# Patient Record
Sex: Female | Born: 1975 | Race: White | Hispanic: No | Marital: Married | Smoking: Former smoker
Health system: Southern US, Community
[De-identification: ages and names within clinical notes are randomized; demographics above are authoritative.]

## PROBLEM LIST (undated history)

## (undated) DIAGNOSIS — Z87442 Personal history of urinary calculi: Secondary | ICD-10-CM

## (undated) DIAGNOSIS — I2699 Other pulmonary embolism without acute cor pulmonale: Secondary | ICD-10-CM

## (undated) DIAGNOSIS — I82409 Acute embolism and thrombosis of unspecified deep veins of unspecified lower extremity: Secondary | ICD-10-CM

## (undated) DIAGNOSIS — I809 Phlebitis and thrombophlebitis of unspecified site: Secondary | ICD-10-CM

## (undated) HISTORY — PX: FOOT SURGERY: SHX648

## (undated) HISTORY — PX: LAPAROSCOPIC GASTRIC SLEEVE RESECTION: SHX5895

## (undated) HISTORY — DX: Phlebitis and thrombophlebitis of unspecified site: I80.9

## (undated) HISTORY — PX: ANKLE SURGERY: SHX546

---

## 1998-02-07 ENCOUNTER — Ambulatory Visit (HOSPITAL_COMMUNITY): Admission: RE | Admit: 1998-02-07 | Discharge: 1998-02-07 | Payer: Self-pay | Admitting: Orthopedic Surgery

## 1998-12-24 ENCOUNTER — Other Ambulatory Visit: Admission: RE | Admit: 1998-12-24 | Discharge: 1998-12-24 | Payer: Self-pay | Admitting: Obstetrics & Gynecology

## 2001-07-30 ENCOUNTER — Encounter: Admission: RE | Admit: 2001-07-30 | Discharge: 2001-07-30 | Payer: Self-pay | Admitting: Family Medicine

## 2001-07-30 ENCOUNTER — Encounter: Payer: Self-pay | Admitting: Family Medicine

## 2001-08-02 ENCOUNTER — Ambulatory Visit (HOSPITAL_COMMUNITY): Admission: RE | Admit: 2001-08-02 | Discharge: 2001-08-02 | Payer: Self-pay | Admitting: Family Medicine

## 2002-07-12 ENCOUNTER — Emergency Department (HOSPITAL_COMMUNITY): Admission: EM | Admit: 2002-07-12 | Discharge: 2002-07-12 | Payer: Self-pay | Admitting: Emergency Medicine

## 2003-02-17 ENCOUNTER — Encounter: Payer: Self-pay | Admitting: Family Medicine

## 2003-02-17 ENCOUNTER — Encounter: Admission: RE | Admit: 2003-02-17 | Discharge: 2003-02-17 | Payer: Self-pay | Admitting: Family Medicine

## 2004-04-05 ENCOUNTER — Emergency Department (HOSPITAL_COMMUNITY): Admission: EM | Admit: 2004-04-05 | Discharge: 2004-04-05 | Payer: Self-pay | Admitting: Emergency Medicine

## 2004-11-01 ENCOUNTER — Other Ambulatory Visit: Admission: RE | Admit: 2004-11-01 | Discharge: 2004-11-01 | Payer: Self-pay | Admitting: Family Medicine

## 2004-11-12 ENCOUNTER — Encounter: Admission: RE | Admit: 2004-11-12 | Discharge: 2004-11-12 | Payer: Self-pay | Admitting: Neurological Surgery

## 2006-10-06 ENCOUNTER — Emergency Department (HOSPITAL_COMMUNITY): Admission: EM | Admit: 2006-10-06 | Discharge: 2006-10-06 | Payer: Self-pay | Admitting: Emergency Medicine

## 2006-11-02 ENCOUNTER — Other Ambulatory Visit: Admission: RE | Admit: 2006-11-02 | Discharge: 2006-11-02 | Payer: Self-pay | Admitting: Family Medicine

## 2007-10-25 ENCOUNTER — Encounter: Admission: RE | Admit: 2007-10-25 | Discharge: 2007-10-25 | Payer: Self-pay | Admitting: Orthopedic Surgery

## 2007-11-22 ENCOUNTER — Other Ambulatory Visit: Admission: RE | Admit: 2007-11-22 | Discharge: 2007-11-22 | Payer: Self-pay | Admitting: Family Medicine

## 2008-07-25 ENCOUNTER — Emergency Department (HOSPITAL_COMMUNITY): Admission: EM | Admit: 2008-07-25 | Discharge: 2008-07-25 | Payer: Self-pay | Admitting: Emergency Medicine

## 2008-11-27 ENCOUNTER — Other Ambulatory Visit: Admission: RE | Admit: 2008-11-27 | Discharge: 2008-11-27 | Payer: Self-pay | Admitting: Family Medicine

## 2009-12-24 ENCOUNTER — Other Ambulatory Visit: Admission: RE | Admit: 2009-12-24 | Discharge: 2009-12-24 | Payer: Self-pay | Admitting: Family Medicine

## 2010-10-06 ENCOUNTER — Encounter: Payer: Self-pay | Admitting: Family Medicine

## 2011-01-22 ENCOUNTER — Other Ambulatory Visit (HOSPITAL_COMMUNITY)
Admission: RE | Admit: 2011-01-22 | Discharge: 2011-01-22 | Disposition: A | Discharge status: Home / Self Care | Payer: Self-pay | Source: Ambulatory Visit | Attending: Family Medicine | Admitting: Family Medicine

## 2011-01-22 ENCOUNTER — Other Ambulatory Visit: Payer: Self-pay | Admitting: Family Medicine

## 2011-01-22 DIAGNOSIS — Z0000 Encounter for general adult medical examination without abnormal findings: Secondary | ICD-10-CM | POA: Insufficient documentation

## 2011-06-17 LAB — BASIC METABOLIC PANEL
CO2: 22
Calcium: 9.2
Creatinine, Ser: 0.8
GFR calc Af Amer: >60
GFR calc non Af Amer: >60

## 2011-06-17 LAB — DIFFERENTIAL
Basophils Absolute: 0.1
Basophils Relative: 1
Eosinophils Absolute: 0.1
Eosinophils Relative: 2
Neutrophils Relative %: 66
Smear Review: ADEQUATE

## 2011-06-17 LAB — CBC
HCT: 40.3
MCHC: 33.7
RDW: 12.9

## 2011-12-15 DIAGNOSIS — I809 Phlebitis and thrombophlebitis of unspecified site: Secondary | ICD-10-CM

## 2011-12-15 HISTORY — DX: Phlebitis and thrombophlebitis of unspecified site: I80.9

## 2011-12-29 ENCOUNTER — Other Ambulatory Visit: Payer: Self-pay | Admitting: Family Medicine

## 2011-12-29 ENCOUNTER — Ambulatory Visit
Admission: RE | Admit: 2011-12-29 | Discharge: 2011-12-29 | Disposition: A | Discharge status: Home / Self Care | Payer: BC Managed Care – PPO | Source: Ambulatory Visit | Attending: Family Medicine | Admitting: Family Medicine

## 2011-12-29 DIAGNOSIS — M79606 Pain in leg, unspecified: Secondary | ICD-10-CM

## 2012-01-23 ENCOUNTER — Other Ambulatory Visit: Payer: Self-pay | Admitting: Family Medicine

## 2012-01-23 ENCOUNTER — Other Ambulatory Visit (HOSPITAL_COMMUNITY)
Admission: RE | Admit: 2012-01-23 | Discharge: 2012-01-23 | Disposition: A | Discharge status: Home / Self Care | Payer: BC Managed Care – PPO | Source: Ambulatory Visit | Attending: Family Medicine | Admitting: Family Medicine

## 2012-01-23 DIAGNOSIS — Z0000 Encounter for general adult medical examination without abnormal findings: Secondary | ICD-10-CM | POA: Insufficient documentation

## 2012-11-23 ENCOUNTER — Ambulatory Visit
Admission: RE | Admit: 2012-11-23 | Discharge: 2012-11-23 | Disposition: A | Discharge status: Home / Self Care | Payer: BC Managed Care – PPO | Source: Ambulatory Visit | Attending: Family Medicine | Admitting: Family Medicine

## 2012-11-23 ENCOUNTER — Other Ambulatory Visit: Payer: Self-pay | Admitting: Family Medicine

## 2012-11-23 ENCOUNTER — Inpatient Hospital Stay (HOSPITAL_COMMUNITY)
Admission: EM | Admit: 2012-11-23 | Discharge: 2012-11-26 | DRG: 541 | Disposition: A | Discharge status: Home / Self Care | Payer: BC Managed Care – PPO | Attending: Internal Medicine | Admitting: Internal Medicine

## 2012-11-23 ENCOUNTER — Encounter (HOSPITAL_COMMUNITY): Payer: Self-pay | Admitting: *Deleted

## 2012-11-23 DIAGNOSIS — R0600 Dyspnea, unspecified: Secondary | ICD-10-CM

## 2012-11-23 DIAGNOSIS — R519 Headache, unspecified: Secondary | ICD-10-CM | POA: Diagnosis present

## 2012-11-23 DIAGNOSIS — I2699 Other pulmonary embolism without acute cor pulmonale: Principal | ICD-10-CM

## 2012-11-23 DIAGNOSIS — Z87891 Personal history of nicotine dependence: Secondary | ICD-10-CM

## 2012-11-23 DIAGNOSIS — I82409 Acute embolism and thrombosis of unspecified deep veins of unspecified lower extremity: Secondary | ICD-10-CM | POA: Diagnosis present

## 2012-11-23 DIAGNOSIS — Z881 Allergy status to other antibiotic agents status: Secondary | ICD-10-CM

## 2012-11-23 DIAGNOSIS — R51 Headache: Secondary | ICD-10-CM | POA: Diagnosis present

## 2012-11-23 DIAGNOSIS — E669 Obesity, unspecified: Secondary | ICD-10-CM | POA: Diagnosis present

## 2012-11-23 DIAGNOSIS — E871 Hypo-osmolality and hyponatremia: Secondary | ICD-10-CM

## 2012-11-23 DIAGNOSIS — Z79899 Other long term (current) drug therapy: Secondary | ICD-10-CM

## 2012-11-23 DIAGNOSIS — Z6841 Body Mass Index (BMI) 40.0 and over, adult: Secondary | ICD-10-CM

## 2012-11-23 DIAGNOSIS — R911 Solitary pulmonary nodule: Secondary | ICD-10-CM

## 2012-11-23 DIAGNOSIS — I82402 Acute embolism and thrombosis of unspecified deep veins of left lower extremity: Secondary | ICD-10-CM | POA: Diagnosis present

## 2012-11-23 LAB — BASIC METABOLIC PANEL
CO2: 18 mEq/L — ABNORMAL LOW (ref 19–32)
Creatinine, Ser: 0.71 mg/dL (ref 0.50–1.10)
GFR calc Af Amer: >90 mL/min (ref >90)
GFR calc non Af Amer: >90 mL/min (ref >90)
Glucose, Bld: 134 mg/dL — ABNORMAL HIGH (ref 70–99)
Potassium: 5.4 mEq/L — ABNORMAL HIGH (ref 3.5–5.1)
Sodium: 134 mEq/L — ABNORMAL LOW (ref 135–145)

## 2012-11-23 LAB — CBC WITH DIFFERENTIAL/PLATELET
HCT: 41.4 % (ref 36.0–46.0)
Lymphocytes Relative: 20 % (ref 12–46)
MCH: 30.6 pg (ref 26.0–34.0)
Neutro Abs: 5.9 10*3/uL (ref 1.7–7.7)
Platelets: 338 10*3/uL (ref 150–400)
RBC: 4.67 MIL/uL (ref 3.87–5.11)
WBC: 8 10*3/uL (ref 4.0–10.5)

## 2012-11-23 LAB — POTASSIUM: Potassium: 3.5 mEq/L (ref 3.5–5.1)

## 2012-11-23 LAB — APTT: aPTT: 27 seconds (ref 24–37)

## 2012-11-23 LAB — PROTIME-INR: INR: 0.94 (ref 0.00–1.49)

## 2012-11-23 LAB — ANTITHROMBIN III: AntiThromb III Func: 92 % (ref 75–120)

## 2012-11-23 MED ORDER — HEPARIN (PORCINE) IN NACL 100-0.45 UNIT/ML-% IJ SOLN
1500.0000 [IU]/h | INTRAMUSCULAR | Status: DC
  Administered 2012-11-23: 1500 [IU]/h via INTRAVENOUS
  Filled 2012-11-23: qty 250

## 2012-11-23 MED ORDER — BISACODYL 5 MG PO TBEC
10.0000 mg | DELAYED_RELEASE_TABLET | Freq: Every day | ORAL | Status: DC | PRN
  Administered 2012-11-25: 10 mg via ORAL
  Filled 2012-11-23 (×2): qty 1

## 2012-11-23 MED ORDER — WARFARIN VIDEO: Freq: Once | Status: DC

## 2012-11-23 MED ORDER — HEPARIN BOLUS VIA INFUSION
4500.0000 [IU] | Freq: Once | INTRAVENOUS | Status: AC
  Administered 2012-11-23: 4500 [IU] via INTRAVENOUS

## 2012-11-23 MED ORDER — ONDANSETRON HCL 4 MG/2ML IJ SOLN: 4.0000 mg | Freq: Four times a day (QID) | INTRAMUSCULAR | Status: DC | PRN

## 2012-11-23 MED ORDER — ACETAMINOPHEN 325 MG PO TABS
650.0000 mg | ORAL_TABLET | Freq: Four times a day (QID) | ORAL | Status: DC | PRN
  Administered 2012-11-24 – 2012-11-25 (×4): 650 mg via ORAL
  Filled 2012-11-23 (×4): qty 2

## 2012-11-23 MED ORDER — HEPARIN (PORCINE) IN NACL 100-0.45 UNIT/ML-% IJ SOLN
1500.0000 [IU]/h | INTRAMUSCULAR | Status: DC
  Filled 2012-11-23: qty 250

## 2012-11-23 MED ORDER — WARFARIN - PHARMACIST DOSING INPATIENT
Freq: Every day | Status: DC
  Administered 2012-11-25: 18:00:00

## 2012-11-23 MED ORDER — ONDANSETRON HCL 4 MG PO TABS
4.0000 mg | ORAL_TABLET | Freq: Four times a day (QID) | ORAL | Status: DC | PRN
  Administered 2012-11-24: 4 mg via ORAL
  Filled 2012-11-23: qty 1

## 2012-11-23 MED ORDER — SODIUM CHLORIDE 0.9 % IV SOLN: INTRAVENOUS | Status: AC

## 2012-11-23 MED ORDER — WARFARIN SODIUM 10 MG PO TABS: 10.0000 mg | ORAL_TABLET | Freq: Once | ORAL | Status: DC

## 2012-11-23 MED ORDER — WARFARIN SODIUM 10 MG PO TABS
10.0000 mg | ORAL_TABLET | Freq: Once | ORAL | Status: AC
  Administered 2012-11-23: 10 mg via ORAL
  Filled 2012-11-23: qty 1

## 2012-11-23 MED ORDER — IOHEXOL 350 MG/ML SOLN
125.0000 mL | Freq: Once | INTRAVENOUS | Status: AC | PRN
  Administered 2012-11-23: 125 mL via INTRAVENOUS

## 2012-11-23 MED ORDER — SODIUM CHLORIDE 0.9 % IV SOLN
INTRAVENOUS | Status: AC
  Administered 2012-11-23: 17:00:00 via INTRAVENOUS

## 2012-11-23 MED ORDER — COUMADIN BOOK
Freq: Once | Status: AC
  Administered 2012-11-24: 10:00:00
  Filled 2012-11-23 (×2): qty 1

## 2012-11-23 MED ORDER — OXYCODONE HCL 5 MG PO TABS
5.0000 mg | ORAL_TABLET | ORAL | Status: DC | PRN
  Administered 2012-11-24 (×3): 5 mg via ORAL
  Filled 2012-11-23 (×3): qty 1

## 2012-11-23 MED ORDER — ACETAMINOPHEN 650 MG RE SUPP: 650.0000 mg | Freq: Four times a day (QID) | RECTAL | Status: DC | PRN

## 2012-11-23 NOTE — ED Notes (Signed)
[id number]<YN> Expected date:<BR> Expected time:<BR> Means of arrival:<BR> Comments:<BR> Hold  For triage

## 2012-11-23 NOTE — ED Provider Notes (Signed)
History     CSN: [id number]  Arrival date & time 11/23/12  1514   First MD Initiated Contact with Patient 11/23/12 1552      Chief Complaint  Patient presents with  . Pulmonary Emboli     (Consider location/radiation/quality/duration/timing/severity/associated sxs/prior treatment) HPI 37 y.o. Female complaining of sob and chest pain began 2/5 and worsening.  Seen at [facility], [place],  Seen twice prior to today and told pulled muscle.  Yesterday, very sob, went in today and saw [place], [place].  CT ordered and done at [facility] imaging.  Positive for pe and told to come here.  Patient has been dyspneic with any exertion,some pain left lateral chest.  Patient has noted some swelling in legs especially with exertion.  Patient on bcp History reviewed. No pertinent past medical history.  History reviewed. No pertinent past surgical history.  No family history on file.  History  Substance Use Topics  . Smoking status: Never Smoker   . Smokeless tobacco: Not on file  . Alcohol Use: No    OB History   Grav Para Term Preterm Abortions TAB SAB Ect Mult Living                  Review of Systems  All other systems reviewed and are negative.    Allergies  Augmentin  Home Medications   Current Outpatient Rx  Name  Route  Sig  Dispense  Refill  . PRESCRIPTION MEDICATION   Oral   Take 1 tablet by mouth daily. Birth control one once a day         . traMADol (ULTRAM) 50 MG tablet   Oral   Take 50 mg by mouth every 6 (six) hours as needed for pain.           BP 162/88  Pulse 98  Temp(Src) 98.4 F (36.9 C) (Oral)  Resp 18  SpO2 98%  LMP 10/26/2012  Physical Exam  Nursing note and vitals reviewed. Constitutional: She appears well-developed and well-nourished.  HENT:  Head: Normocephalic and atraumatic.  Eyes: Conjunctivae and EOM are normal. Pupils are equal, round, and reactive to light.  Neck: Normal range of motion. Neck supple.  Cardiovascular: Normal  rate, regular rhythm, normal heart sounds and intact distal pulses.   Patient tachycardiac with sitting up in bed.   Pulmonary/Chest: Effort normal and breath sounds normal.  Sat 93 %  Abdominal: Soft. Bowel sounds are normal.  Musculoskeletal: Normal range of motion.  Neurological: She is alert.  Skin: Skin is warm and dry.  Psychiatric: She has a normal mood and affect. Thought content normal.    ED Course  Procedures (including critical care time)  Labs Reviewed  CBC WITH DIFFERENTIAL  BASIC METABOLIC PANEL   Ct Angio Chest Pe W/cm &/or Wo Cm  11/23/2012  *RADIOLOGY REPORT*  Clinical Data: Dyspnea, evaluate for pulmonary embolism  CT ANGIOGRAPHY CHEST  Technique:  Multidetector CT imaging of the chest using the standard protocol during bolus administration of intravenous contrast. Multiplanar reconstructed images including MIPs were obtained and reviewed to evaluate the vascular anatomy.  Contrast:  125 ml Omnipaque 350  Comparison: CT abdomen and pelvis - 04/05/2004  Vascular Findings:  There is suboptimal opacification of the pulmonary arterial system with the main pulmonary artery measuring only 277 HU.  Given this limitation, there is saddle nonocclusive emboli within the right and left main pulmonary arteries.  There thrombus extending into all segmental pulmonary arteries of both lungs.  Given the suboptimal vessel opacification, evaluation for occlusive thrombus is difficult. Overall, the clot burden is large.  No evidence of pulmonary infarction.  Normal caliber of the main pulmonary artery measuring 2.9 cm in diameter.  There is a minimal bowing of the interventricular septum which may be a sign of right-sided heart failure.  There is no reflux of administer intravenous contrast into the hepatic venous system.  Normal heart size.  No pericardial effusion.  Normal caliber of the thoracic aorta.  The bovine configuration of the aortic arch is incidentally noted.  No definite thoracic  aortic dissection or periaortic stranding.  -----------------------------------------------------------  Nonvascular findings:  No focal airspace opacities.  No evidence of pulmonary infarction. No pleural effusion or pneumothorax.  There is a smoothly marginated approximately 2.8 x 1.5 cm nodule within the left costophrenic angle which may be arising from the adjacent pleura.  There is no associated surrounding ground-glass or spiculation.  No mediastinal, hilar or axillary lymphadenopathy.  Limited early arterial phase evaluation of the upper abdomen is normal.  There is no definitive reflux of intravenous contrast into the hepatic venous system.  No acute or aggressive osseous abnormalities.  A bone island is incidentally noted within the inferior aspect of the T4 vertebral body.  A limbus body is noted at the T11 vertebral body.  IMPRESSION:  1.  Examination is positive for a large burden of pulmonary embolism with possible bowing of the interventricular septum, nonspecific but could be seen in the setting of early right-sided heart failure.  Further evaluation with cardiac echo may be performed as clinically indicated.  2.  Smoothly marginated approximately 2.8 cm nodule within the left costophrenic angle which may be possibly arising from the adjacent pleura.  While nonspecific, this may represent a fibrous tumor of the pleura, possibly a hamartoma. While imaging characteristics suggests a benign etiology, this nodule was not present on prior abdominal CT performed 04/05/2004.  Further evaluation may be obtained with a either a PET CT or surveillance chest CT in 3 months.  Above findings discussed Dr. Wynelle Link at [phone].   Original Report Authenticated By: Tacey Ruiz, MD      No diagnosis found.    Date: 11/23/2012  Rate: 93  Rhythm: normal sinus rhythm  QRS Axis: normal  Intervals: normal  ST/T Wave abnormalities: normal  Conduction Disutrbances:lvh  Narrative Interpretation:   Old EKG Reviewed: none  available   MDM  Patient presented with ct positive for multiple pe.  She is hemodynamically stable here although tachycardiac with any exertion.  Heparin has been ordered by pharmacy protocol. I have paged the hospitalists for [place] for anticoagulation.        Hilario Quarry, MD 11/23/12 [phone]

## 2012-11-23 NOTE — ED Notes (Signed)
Attempted to call report x1. RN sts she cannot get to phone and requests to call back.

## 2012-11-23 NOTE — ED Notes (Signed)
Pt reports having chest pain/sob Dx as muscular pain since February. Sob worsened in last week. Went back to PCP and sent to [facility] for CT angio. CT found multiple bil PEs. Pt denies current chest pain. Reports intermittent sob.

## 2012-11-23 NOTE — ED Notes (Signed)
Pt transported to 1440 via stretcher on cardiac monitor with chart and personal belongings accompanied by this nurse and family. Condition stable at time of transfer.

## 2012-11-23 NOTE — Progress Notes (Addendum)
ANTICOAGULATION CONSULT NOTE - Initial Consult  Pharmacy Consult for Heparin/Warfarin Indication: bilateral PE's  Allergies  Allergen Reactions  . Augmentin (Amoxicillin-Pot Clavulanate) Nausea And Vomiting    Patient Measurements: Height: 5\' 6"  (167.6 cm) Weight: 257 lb 14.4 oz (116.983 kg) IBW/kg (Calculated) : Betty.3 Heparin Dosing Weight: 86.94 kg  Vital Signs: Temp: 98.4 Perkins (36.9 C) (03/11 1543) Temp src: Oral (03/11 1543) BP: 162/88 mmHg (03/11 1543) Pulse Rate: 98 (03/11 1543)  Labs:  Recent Labs  11/23/12 1608  HGB 14.3  HCT 41.4  PLT 338    Estimated Creatinine Clearance: 127.7 ml/min (by C-G formula based on Cr of 0.8).   Medical History: History reviewed. No pertinent past medical history.  Medications:  Scheduled:  Infusions:  PRN:     Assessment:  37 yo obsese Perkins with multiple bilateral PE's.  Patient on birth control PTA, also with positive family history of clots,   Starting IV heparin/Warfarin  Heparin dosing weight = 87 kg  Baseline PT/INR/CBC wnl to proceed with heparin   Goal of Therapy:  Heparin level 0.3-0.7 units/ml Monitor platelets by anticoagulation protocol: Yes INR 2-3   Plan:  1.) Heparin 4500 unit bolus x 1, Then start 1500 units/hr 2.) Heparin level 6 hours after start of gtt 3.) Daily CBC and heparin level once therapeutic   [place], Loma Messing PharmD Pager #: [phone] 4:38 PM 11/23/2012   ADDENDUM: Warfarin per pharmacy started PT/INR on admission at baseline  1.) Warfarin 10 mg po x 1 tonight 2.) Daily PT/INR 3.) Warfarin book/video/education   Clydene Fake PharmD Pager #: [phone] 8:08 PM 11/23/2012

## 2012-11-23 NOTE — H&P (Signed)
[facility] History and Physical  Betty Perkins:[id number] DOB: [birth year] DOA: 11/23/2012  Referring physician: Dr. Rosalia Hammers PCP: No primary provider on file.  Specialists:   Chief Complaint: Worsening shortness of breath  HPI: Betty Perkins is a 37 y.o. female obese female on birth control pills, and otherwise no significant past medical history who presents with above complaints. She states that her symptoms began in February 5 when she developed soreness in her back and later that day the pain became more pleuritic. She states she has had a non-productive cough as well. She went to the walk-in clinic and was diagnosed with musculoskeletal skeletal strain, and she reports about 5 days later she was having more dyspnea on exertion so she followed back up and was in whole to see her PCP in 3 weeks if she was not improving. She continued to have shortness of breath that was worsening and reports having some mild left greater than right leg swelling that appeared to have resolved. She went to see her PCP today and a CT angiogram was ordered and showed large burden of pulmonary embolus with possible bowing of interventricular septum-nonspecific but could be seen in setting of early right-sided heart failure. She was started on heparin and is admitted for further evaluation and management. She reports that her mother and younger brother have had blood clots. She denies any long distance travel.   Review of Systems: The patient denies anorexia, fever, weight loss,, vision loss, decreased hearing, hoarseness, chest pain, syncope, dyspnea on exertion, peripheral edema, balance deficits, hemoptysis, abdominal pain, melena, hematochezia, severe indigestion/heartburn, hematuria, incontinence, muscle weakness, transient blindness, difficulty walking, depression, unusual weight change, abnormal bleeding.   Past Medical History  Diagnosis Date  . Medical history non-contributory    Past  Surgical History  Procedure Laterality Date  . Ankle surgery      right ankle  . Foot surgery      left foot   Social History:  reports that she quit smoking about 6 years ago. She has never used smokeless tobacco. She reports that she does not drink alcohol or use illicit drugs. where does patient live--home Can patient participate in ADLs  Allergies  Allergen Reactions  . Augmentin (Amoxicillin-Pot Clavulanate) Nausea And Vomiting    Family history-her mother and brother have both had blood clots (her brother is currently on Coumadin)  Prior to Admission medications   Medication Sig Start Date End Date Taking? Authorizing Provider  PRESCRIPTION MEDICATION Take 1 tablet by mouth daily. Birth control one once a day   Yes Historical Provider, MD  traMADol (ULTRAM) 50 MG tablet Take 50 mg by mouth every 6 (six) hours as needed for pain.   Yes Historical Provider, MD   Physical Exam: Filed Vitals:   11/23/12 1600 11/23/12 1628 11/23/12 1630 11/23/12 1700  BP: 145/88  145/90 153/95  Pulse: 91  93 92  Temp:      TempSrc:      Resp: 15  23 21   Height:  5\' 6"  (1.676 m)    Weight:  116.983 kg (257 lb 14.4 oz)    SpO2: 98%  96% 97%    Constitutional: Vital signs reviewed.  Patient is a well-developed,obese in no acute distress and cooperative with exam. Alert and oriented x3.  Head: Normocephalic and atraumatic Mouth: no erythema or exudates, MMM Eyes: PERRL, EOMI, conjunctivae normal, No scleral icterus.  Neck: Supple, Trachea midline normal ROM, No JVD, mass, thyromegaly, or carotid bruit present.  Cardiovascular: RRR, S1 normal, S2 normal, no MRG, pulses symmetric and intact bilaterally Pulmonary/Chest: Decreased breath sounds at the bases no wheezes, rales, or rhonchi Abdominal: Soft. Non-tender, non-distended, bowel sounds are normal, no masses, organomegaly, or guarding present.  GU: no CVA tenderness  extremities: No cyanosis and no edema, no calf tenderness, Homans sign  negative  Neurological: A&O x3, Strength is normal and symmetric bilaterally, cranial nerve II-XII are grossly intact, no focal motor deficit, sensory intact to light touch bilaterally.  Skin: Warm, dry and intact. No rash, cyanosis, or clubbing.  Psychiatric: Normal mood and affect. speech and behavior is normal. Judgment and thought content normal. Cognition and memory are normal.    Labs on Admission:  Basic Metabolic Panel:  Recent Labs Lab 11/23/12 1608 11/23/12 1737  NA 134*  --   K 5.4* 3.5  CL 101  --   CO2 18*  --   GLUCOSE 134*  --   BUN 11  --   CREATININE 0.71  --   CALCIUM 9.6  --    Liver Function Tests: No results found for this basename: AST, ALT, ALKPHOS, BILITOT, PROT, ALBUMIN,  in the last 168 hours No results found for this basename: LIPASE, AMYLASE,  in the last 168 hours No results found for this basename: AMMONIA,  in the last 168 hours CBC:  Recent Labs Lab 11/23/12 1608  WBC 8.0  NEUTROABS 5.9  HGB 14.3  HCT 41.4  MCV 88.7  PLT 338   Cardiac Enzymes: No results found for this basename: CKTOTAL, CKMB, CKMBINDEX, TROPONINI,  in the last 168 hours  BNP (last 3 results) No results found for this basename: PROBNP,  in the last 8760 hours CBG: No results found for this basename: GLUCAP,  in the last 168 hours  Radiological Exams on Admission: Ct Angio Chest Pe W/cm &/or Wo Cm  11/23/2012  *RADIOLOGY REPORT*  Clinical Data: Dyspnea, evaluate for pulmonary embolism  CT ANGIOGRAPHY CHEST  Technique:  Multidetector CT imaging of the chest using the standard protocol during bolus administration of intravenous contrast. Multiplanar reconstructed images including MIPs were obtained and reviewed to evaluate the vascular anatomy.  Contrast:  125 ml Omnipaque 350  Comparison: CT abdomen and pelvis - 04/05/2004  Vascular Findings:  There is suboptimal opacification of the pulmonary arterial system with the main pulmonary artery measuring only 277 HU.  Given this  limitation, there is saddle nonocclusive emboli within the right and left main pulmonary arteries.  There thrombus extending into all segmental pulmonary arteries of both lungs.  Given the suboptimal vessel opacification, evaluation for occlusive thrombus is difficult. Overall, the clot burden is large.  No evidence of pulmonary infarction.  Normal caliber of the main pulmonary artery measuring 2.9 cm in diameter.  There is a minimal bowing of the interventricular septum which may be a sign of right-sided heart failure.  There is no reflux of administer intravenous contrast into the hepatic venous system.  Normal heart size.  No pericardial effusion.  Normal caliber of the thoracic aorta.  The bovine configuration of the aortic arch is incidentally noted.  No definite thoracic aortic dissection or periaortic stranding.  -----------------------------------------------------------  Nonvascular findings:  No focal airspace opacities.  No evidence of pulmonary infarction. No pleural effusion or pneumothorax.  There is a smoothly marginated approximately 2.8 x 1.5 cm nodule within the left costophrenic angle which may be arising from the adjacent pleura.  There is no associated surrounding ground-glass or spiculation.  No mediastinal,  hilar or axillary lymphadenopathy.  Limited early arterial phase evaluation of the upper abdomen is normal.  There is no definitive reflux of intravenous contrast into the hepatic venous system.  No acute or aggressive osseous abnormalities.  A bone island is incidentally noted within the inferior aspect of the T4 vertebral body.  A limbus body is noted at the T11 vertebral body.  IMPRESSION:  1.  Examination is positive for a large burden of pulmonary embolism with possible bowing of the interventricular septum, nonspecific but could be seen in the setting of early right-sided heart failure.  Further evaluation with cardiac echo may be performed as clinically indicated.  2.  Smoothly  marginated approximately 2.8 cm nodule within the left costophrenic angle which may be possibly arising from the adjacent pleura.  While nonspecific, this may represent a fibrous tumor of the pleura, possibly a hamartoma. While imaging characteristics suggests a benign etiology, this nodule was not present on prior abdominal CT performed 04/05/2004.  Further evaluation may be obtained with a either a PET CT or surveillance chest CT in 3 months.  Above findings discussed Dr. Wynelle Link at [phone].   Original Report Authenticated By: Tacey Ruiz, MD       Assessment/Plan Active Problems:   Acute pulmonary embolism -As discussed above, in obese patient on oral contraceptives and positive family history of clots -Will obtain hypercoagulable panel, obtain lower extremity Dopplers to evaluate for DVT -Hold off birth control pills -Continue heparin started, and Coumadin per pharmacy  -Will obtain a 2-D echo to further evaluate with bowing interventricular septum noted on CTA   Hyponatremia -Likely secondary to volume Depletion, hydrate follow and recheck Left lower lobe nodule-? Hamartoma,  -followup CT of chest recommended in 3 months  Code Status: FULL Family Communication: Fianc and mother at bedside Disposition Plan: Admit to telemetry  Time spent: >16min  Kela Millin [facility] Pager [phone]  If 7PM-7AM, please contact night-coverage [email or web address] Password [email or web address] 11/23/2012, 7:18 PM

## 2012-11-24 DIAGNOSIS — R519 Headache, unspecified: Secondary | ICD-10-CM | POA: Diagnosis present

## 2012-11-24 DIAGNOSIS — I82409 Acute embolism and thrombosis of unspecified deep veins of unspecified lower extremity: Secondary | ICD-10-CM

## 2012-11-24 DIAGNOSIS — I2699 Other pulmonary embolism without acute cor pulmonale: Secondary | ICD-10-CM

## 2012-11-24 DIAGNOSIS — I517 Cardiomegaly: Secondary | ICD-10-CM

## 2012-11-24 DIAGNOSIS — R51 Headache: Secondary | ICD-10-CM | POA: Diagnosis present

## 2012-11-24 DIAGNOSIS — I82402 Acute embolism and thrombosis of unspecified deep veins of left lower extremity: Secondary | ICD-10-CM | POA: Diagnosis present

## 2012-11-24 LAB — BASIC METABOLIC PANEL WITH GFR
BUN: 13 mg/dL (ref 6–23)
CO2: 23 meq/L (ref 19–32)
Calcium: 8.9 mg/dL (ref 8.4–10.5)
Chloride: 106 meq/L (ref 96–112)
Creatinine, Ser: 0.9 mg/dL (ref 0.50–1.10)
GFR calc Af Amer: >90 mL/min
GFR calc non Af Amer: 82 mL/min — ABNORMAL LOW
Glucose, Bld: 116 mg/dL — ABNORMAL HIGH (ref 70–99)
Potassium: 3.8 meq/L (ref 3.5–5.1)
Sodium: 138 meq/L (ref 135–145)

## 2012-11-24 LAB — HOMOCYSTEINE: Homocysteine: 7.9 umol/L (ref 4.0–15.4)

## 2012-11-24 LAB — PROTIME-INR
INR: 1.01 (ref 0.00–1.49)
Prothrombin Time: 13.2 s (ref 11.6–15.2)

## 2012-11-24 LAB — LUPUS ANTICOAGULANT PANEL: Lupus Anticoagulant: NOT DETECTED

## 2012-11-24 LAB — PROTEIN S, TOTAL: Protein S Ag, Total: 84 % (ref 60–150)

## 2012-11-24 MED ORDER — HEPARIN (PORCINE) IN NACL 100-0.45 UNIT/ML-% IJ SOLN
1600.0000 [IU]/h | INTRAMUSCULAR | Status: DC
  Administered 2012-11-24 (×2): 1600 [IU]/h via INTRAVENOUS
  Filled 2012-11-24 (×4): qty 250

## 2012-11-24 MED ORDER — PSEUDOEPHEDRINE HCL ER 120 MG PO TB12
120.0000 mg | ORAL_TABLET | Freq: Two times a day (BID) | ORAL | Status: DC | PRN
  Administered 2012-11-24: 120 mg via ORAL
  Filled 2012-11-24: qty 1

## 2012-11-24 MED ORDER — WARFARIN SODIUM 10 MG PO TABS
10.0000 mg | ORAL_TABLET | Freq: Once | ORAL | Status: AC
  Administered 2012-11-24: 10 mg via ORAL
  Filled 2012-11-24: qty 1

## 2012-11-24 MED ORDER — METOCLOPRAMIDE HCL 5 MG/ML IJ SOLN
10.0000 mg | Freq: Once | INTRAMUSCULAR | Status: AC
  Administered 2012-11-24: 10 mg via INTRAVENOUS
  Filled 2012-11-24: qty 2

## 2012-11-24 MED ORDER — MORPHINE SULFATE 15 MG PO TABS
15.0000 mg | ORAL_TABLET | Freq: Once | ORAL | Status: AC
  Administered 2012-11-24: 15 mg via ORAL
  Filled 2012-11-24: qty 1

## 2012-11-24 NOTE — Progress Notes (Signed)
*  PRELIMINARY RESULTS* Echocardiogram 2D Echocardiogram has been performed.  Betty Perkins 11/24/2012, 9:46 AM

## 2012-11-24 NOTE — Progress Notes (Signed)
[facility] PROGRESS NOTE  JAZZMA NEIDHARDT FAO:[id number] DOB: 1975-12-14 DOA: 11/23/2012 PCP: No primary provider on file.  Assessment/Plan:  #1 acute PE/ left lower extremity DVT Personable etiology. Patient does have a family history of clots. Patient was also obese and noted to be on oral contraceptives. Hypercoagulable panel is pending. Lower extremity Dopplers positive for left lower extremity nonobstructive DVT. Birth control pills on hold. 2-D echo negative for right ventricular strain. Continue IV heparin and Coumadin.  #2 hyponatremia Likely secondary to volume depletion. Improved. Continue IV fluids. Follow.  #3 left lower lobe nodule  Followup CT scan 3 months  #4 headache  Will give a dose of IV Reglan and a one-time dose of MSIR. Follow.   Code Status: Full Family Communication: Updated patient and family at bedside. Disposition Plan: Home when medically stable.    Consultants:  None  Procedures:  Lower extremity Dopplers 11/24/2012  2-D echo 11/24/2012  CT chest 11/23/2012  Antibiotics:  None  HPI/Subjective: Patient complaining of a headache. Patient denies any chest pain. Patient states shortness of breath is improved.  Objective: Filed Vitals:   11/23/12 1936 11/23/12 2146 11/24/12 0531 11/24/12 1329  BP: 153/97 122/68 124/80 155/95  Pulse: 82 77 60 68  Temp: 98.1 F (36.7 C) 98.1 F (36.7 C) 97.8 F (36.6 C) 98.1 F (36.7 C)  TempSrc: Oral Oral Oral Oral  Resp: 18 18 18 18   Height: 5\' 6"  (1.676 m)     Weight: 116.756 kg (257 lb 6.4 oz)     SpO2: 97% 100% 97% 99%    Intake/Output Summary (Last 24 hours) at 11/24/12 1855 Last data filed at 11/24/12 1300  Gross per 24 hour  Intake 1406.5 ml  Output      2 ml  Net 1404.5 ml   Filed Weights   11/23/12 1628 11/23/12 1936  Weight: 116.983 kg (257 lb 14.4 oz) 116.756 kg (257 lb 6.4 oz)    Exam:   General:  NAD  Cardiovascular: RRR. No JVD, No LE edema  Respiratory:  Clear to auscultation bilaterally. No wheezing, crackles, or rhonchi.  Abdomen: Soft, nontender, nondistended, positive bowel sounds.  Data Reviewed: Basic Metabolic Panel:  Recent Labs Lab 11/23/12 1608 11/23/12 1737 11/24/12 0428  NA 134*  --  138  K 5.4* 3.5 3.8  CL 101  --  106  CO2 18*  --  23  GLUCOSE 134*  --  116*  BUN 11  --  13  CREATININE 0.71  --  0.90  CALCIUM 9.6  --  8.9   Liver Function Tests: No results found for this basename: AST, ALT, ALKPHOS, BILITOT, PROT, ALBUMIN,  in the last 168 hours No results found for this basename: LIPASE, AMYLASE,  in the last 168 hours No results found for this basename: AMMONIA,  in the last 168 hours CBC:  Recent Labs Lab 11/23/12 1608  WBC 8.0  NEUTROABS 5.9  HGB 14.3  HCT 41.4  MCV 88.7  PLT 338   Cardiac Enzymes: No results found for this basename: CKTOTAL, CKMB, CKMBINDEX, TROPONINI,  in the last 168 hours BNP (last 3 results) No results found for this basename: PROBNP,  in the last 8760 hours CBG: No results found for this basename: GLUCAP,  in the last 168 hours  No results found for this or any previous visit (from the past 240 hour(s)).   Studies: Ct Angio Chest Pe W/cm &/or Wo Cm  11/23/2012  *RADIOLOGY REPORT*  Clinical Data: Dyspnea,  evaluate for pulmonary embolism  CT ANGIOGRAPHY CHEST  Technique:  Multidetector CT imaging of the chest using the standard protocol during bolus administration of intravenous contrast. Multiplanar reconstructed images including MIPs were obtained and reviewed to evaluate the vascular anatomy.  Contrast:  125 ml Omnipaque 350  Comparison: CT abdomen and pelvis - 04/05/2004  Vascular Findings:  There is suboptimal opacification of the pulmonary arterial system with the main pulmonary artery measuring only 277 HU.  Given this limitation, there is saddle nonocclusive emboli within the right and left main pulmonary arteries.  There thrombus extending into all segmental pulmonary  arteries of both lungs.  Given the suboptimal vessel opacification, evaluation for occlusive thrombus is difficult. Overall, the clot burden is large.  No evidence of pulmonary infarction.  Normal caliber of the main pulmonary artery measuring 2.9 cm in diameter.  There is a minimal bowing of the interventricular septum which may be a sign of right-sided heart failure.  There is no reflux of administer intravenous contrast into the hepatic venous system.  Normal heart size.  No pericardial effusion.  Normal caliber of the thoracic aorta.  The bovine configuration of the aortic arch is incidentally noted.  No definite thoracic aortic dissection or periaortic stranding.  -----------------------------------------------------------  Nonvascular findings:  No focal airspace opacities.  No evidence of pulmonary infarction. No pleural effusion or pneumothorax.  There is a smoothly marginated approximately 2.8 x 1.5 cm nodule within the left costophrenic angle which may be arising from the adjacent pleura.  There is no associated surrounding ground-glass or spiculation.  No mediastinal, hilar or axillary lymphadenopathy.  Limited early arterial phase evaluation of the upper abdomen is normal.  There is no definitive reflux of intravenous contrast into the hepatic venous system.  No acute or aggressive osseous abnormalities.  A bone island is incidentally noted within the inferior aspect of the T4 vertebral body.  A limbus body is noted at the T11 vertebral body.  IMPRESSION:  1.  Examination is positive for a large burden of pulmonary embolism with possible bowing of the interventricular septum, nonspecific but could be seen in the setting of early right-sided heart failure.  Further evaluation with cardiac echo may be performed as clinically indicated.  2.  Smoothly marginated approximately 2.8 cm nodule within the left costophrenic angle which may be possibly arising from the adjacent pleura.  While nonspecific, this may  represent a fibrous tumor of the pleura, possibly a hamartoma. While imaging characteristics suggests a benign etiology, this nodule was not present on prior abdominal CT performed 04/05/2004.  Further evaluation may be obtained with a either a PET CT or surveillance chest CT in 3 months.  Above findings discussed Dr. Wynelle Link at [phone].   Original Report Authenticated By: Tacey Ruiz, MD     Scheduled Meds: . metoCLOPramide (REGLAN) injection  10 mg Intravenous Once  . morphine  15 mg Oral Once  . warfarin   Does not apply Once  . Warfarin - Pharmacist Dosing Inpatient   Does not apply q1800   Continuous Infusions: . heparin 1,600 Units/hr (11/24/12 2130)    Principal Problem:   Acute pulmonary embolism Active Problems:   Hyponatremia   Left leg DVT   Headache    Time spent: > 30 mins    [facility]  [facility] Pager [phone]. If 7PM-7AM, please contact night-coverage at [email or web address], password [facility] 11/24/2012, 6:55 PM  LOS: 1 day

## 2012-11-24 NOTE — Progress Notes (Signed)
VASCULAR LAB PRELIMINARY  PRELIMINARY  PRELIMINARY  PRELIMINARY  Bilateral lower extremity venous duplex  completed.    Preliminary report:  Right:  No evidence of DVT, superficial thrombosis, or Baker's cyst.  Left:  Non occlusive DVT of indeterminate age noted in the distal popliteal vein/proximal tibial peroneal trunk.  No evidence of superficial thrombosis.  No Baker's cyst.   Jaloni Sorber, RVT 11/24/2012, 9:04 AM

## 2012-11-24 NOTE — Progress Notes (Signed)
   CARE MANAGEMENT NOTE 11/24/2012  Patient:  Betty Perkins, Betty Perkins   Account Number:  [id number]  Date Initiated:  11/24/2012  Documentation initiated by:  Jiles Crocker  Subjective/Objective Assessment:   ADMITTED WITH PE     Action/Plan:   INDEPENDENT PRIOR TO ADMISSION; HAS PRIVATE INSURANCE - [employer] [place]   Anticipated DC Date:  11/29/2012   Anticipated DC Plan:  HOME/SELF CARE      DC Planning Services  CM consult     Status of service:  Completed, signed off Medicare Important Message given?  NA - LOS <3 / Initial given by admissions (If response is "NO", the following Medicare IM given date fields will be blank) Per UR Regulation:  Reviewed for med. necessity/level of care/duration of stay Comments:  11/24/2012- B CHANDLER RN,BSN,MHA

## 2012-11-24 NOTE — Progress Notes (Signed)
ANTICOAGULATION CONSULT NOTE - Follow Up Consult  Pharmacy Consult for Heparin Indication: pulmonary embolus-Bilat  Allergies  Allergen Reactions  . Augmentin (Amoxicillin-Pot Clavulanate) Nausea And Vomiting    Patient Measurements: Height: 5\' 6"  (167.6 cm) Weight: 257 lb 6.4 oz (116.756 kg) IBW/kg (Calculated) : 59.3 Heparin Dosing Weight:   Vital Signs: Temp: 98.1 F (36.7 C) (03/11 2146) Temp src: Oral (03/11 2146) BP: 122/68 mmHg (03/11 2146) Pulse Rate: 77 (03/11 2146)  Labs:  Recent Labs  11/23/12 1608 11/23/12 2145  HGB 14.3  --   HCT 41.4  --   PLT 338  --   APTT 27  --   LABPROT 12.5  --   INR 0.94  --   HEPARINUNFRC  --  0.31  CREATININE 0.71  --     Estimated Creatinine Clearance: 127.5 ml/min (by C-G formula based on Cr of 0.71).   Medications:  Infusions:  . sodium chloride 75 mL/hr at 11/23/12 2055  . heparin    . [DISCONTINUED] heparin 1,500 Units/hr (11/23/12 1720)  . [DISCONTINUED] heparin 1,500 Units/hr (11/23/12 2055)    Assessment: Patient with heparin level at goal but lower end of goal.  No issues per RN.  Goal of Therapy:  Heparin level 0.3-0.7 units/ml Monitor platelets by anticoagulation protocol: Yes   Plan:  Increase heparin to 1600 units/hr to assure remains at goal.  Recheck levels with am HL.  Darlina Guys, Jacquenette Shone Crowford 11/24/2012,12:05 AM

## 2012-11-24 NOTE — Progress Notes (Signed)
Patient has prescription coverage with [employer] - benefit check for lovenox per certified medical assistant;  PT COPAY WILL BE $84 FOR GENERIC ; $250 FOR NAME BRAND

## 2012-11-24 NOTE — Progress Notes (Signed)
ANTICOAGULATION CONSULT NOTE - Follow Up  Pharmacy Consult for Heparin and Warfarin Indication: Bilateral PE's, DVT  Allergies  Allergen Reactions  . Augmentin (Amoxicillin-Pot Clavulanate) Nausea And Vomiting    Patient Measurements: Height: 5\' 6"  (167.6 cm) Weight: 257 lb 6.4 oz (116.756 kg) IBW/kg (Calculated) : 59.3 Heparin Dosing Weight: 86.94 kg  Labs:  Recent Labs  11/23/12 1608 11/23/12 2145 11/24/12 0428 11/24/12 0800  HGB 14.3  --   --   --   HCT 41.4  --   --   --   PLT 338  --   --   --   APTT 27  --   --   --   LABPROT 12.5  --  13.2  --   INR 0.94  --  1.01  --   HEPARINUNFRC  --  0.31  --  0.47  CREATININE 0.71  --  0.90  --     Estimated Creatinine Clearance: 113.4 ml/min (by C-G formula based on Cr of 0.9).   Medications:  Scheduled:  Infusions:  PRN: acetaminophen, acetaminophen, bisacodyl, ondansetron (ZOFRAN) IV, ondansetron, oxyCODONE  Assessment:  37 yo obese F with multiple bilateral PE's.  Patient on birth control PTA, also with positive family history of clots  3/12 prelim vascular report indicates a nonocclusive DVT in leg  Today is Day #2 of 5 day minimum overlap with warfarin/heparin. Pt will need IV heparin (or SQ Lovenox) for a minimum of 5 days AND until INR > 2 x2 consecutive days.  INR is subtherapeutic as expected after only one dose of warfarin.  Heparin level remains therapeutic. Will continue at current rate and recheck HL in am.   Goal of Therapy:  Heparin level 0.3-0.7 units/ml Monitor platelets by anticoagulation protocol: Yes INR 2-3   Plan:  1) Continue heparin at 1600 units/hr 2) Repeat warfarin 10mg  PO x1 at 18:00 3) F/U daily INR and HL 4) Continue heparin (or Lovenox) for 4 more days AND until INR>2 x2 consecutive days. 5) Warfarin education to be completed prior to discharge.  Darrol Angel, PharmD Pager: [phone] 11/24/2012 10:10 AM

## 2012-11-25 LAB — BASIC METABOLIC PANEL
BUN: 10 mg/dL (ref 6–23)
GFR calc Af Amer: >90 mL/min (ref >90)
GFR calc non Af Amer: 83 mL/min — ABNORMAL LOW (ref >90)
Potassium: 4.1 mEq/L (ref 3.5–5.1)
Sodium: 139 mEq/L (ref 135–145)

## 2012-11-25 LAB — PROTEIN S ACTIVITY: Protein S Activity: 103 % (ref 69–129)

## 2012-11-25 LAB — BETA-2-GLYCOPROTEIN I ABS, IGG/M/A
Beta-2 Glyco I IgG: 2 G Units (ref <20)
Beta-2-Glycoprotein I IgA: 43 A Units — ABNORMAL HIGH (ref <20)

## 2012-11-25 LAB — PROTIME-INR
INR: 1.08 (ref 0.00–1.49)
Prothrombin Time: 13.9 seconds (ref 11.6–15.2)

## 2012-11-25 LAB — CBC
Hemoglobin: 12.3 g/dL (ref 12.0–15.0)
MCHC: 32.7 g/dL (ref 30.0–36.0)
RBC: 4.27 MIL/uL (ref 3.87–5.11)

## 2012-11-25 LAB — PROTEIN C ACTIVITY: Protein C Activity: 188 % — ABNORMAL HIGH (ref 75–133)

## 2012-11-25 LAB — HEPARIN LEVEL (UNFRACTIONATED): Heparin Unfractionated: 0.57 IU/mL (ref 0.30–0.70)

## 2012-11-25 MED ORDER — WARFARIN SODIUM 10 MG PO TABS
10.0000 mg | ORAL_TABLET | Freq: Once | ORAL | Status: AC
  Administered 2012-11-25: 10 mg via ORAL
  Filled 2012-11-25 (×2): qty 1

## 2012-11-25 MED ORDER — ENOXAPARIN SODIUM 120 MG/0.8ML ~~LOC~~ SOLN
120.0000 mg | Freq: Two times a day (BID) | SUBCUTANEOUS | Status: DC
  Administered 2012-11-25 – 2012-11-26 (×2): 120 mg via SUBCUTANEOUS
  Filled 2012-11-25 (×3): qty 0.8

## 2012-11-25 MED ORDER — ENOXAPARIN SODIUM 120 MG/0.8ML ~~LOC~~ SOLN
120.0000 mg | Freq: Two times a day (BID) | SUBCUTANEOUS | Status: DC
  Administered 2012-11-25: 120 mg via SUBCUTANEOUS
  Filled 2012-11-25 (×2): qty 0.8

## 2012-11-25 MED ORDER — ENOXAPARIN (LOVENOX) PATIENT EDUCATION KIT
PACK | Freq: Once | Status: AC
  Administered 2012-11-25: 23:00:00
  Filled 2012-11-25: qty 1

## 2012-11-25 NOTE — Progress Notes (Signed)
[facility] PROGRESS NOTE  HANIYAH MACIOLEK ZOX:[id number] DOB: Jul 02, 1976 DOA: 11/23/2012 PCP: No primary provider on file.  Assessment/Plan:  #1 acute PE/ left lower extremity DVT Personable etiology. Patient does have a family history of clots. Patient was also obese and noted to be on oral contraceptives. Hypercoagulable panel is pending. Lower extremity Dopplers positive for left lower extremity nonobstructive DVT. Birth control pills on hold. 2-D echo negative for right ventricular strain. Change IV heparin to lovenox and continue Coumadin.  #2 hyponatremia Likely secondary to volume depletion. Improved. NSL IV fluids. Follow.  #3 left lower lobe nodule  Followup CT scan 3 months  #4 headache  Improved.   Code Status: Full Family Communication: Updated patient and family at bedside. Disposition Plan: Home when medically stable.    Consultants:  None  Procedures:  Lower extremity Dopplers 11/24/2012  2-D echo 11/24/2012  CT chest 11/23/2012  Antibiotics:  None  HPI/Subjective: Patient states headaches improved. Patient denies any chest pain. Patient states shortness of breath is improved.  Objective: Filed Vitals:   11/24/12 1329 11/24/12 2100 11/25/12 0545 11/25/12 1500  BP: 155/95 137/86 103/67 143/68  Pulse: 68 73 55 66  Temp: 98.1 F (36.7 C) 97.8 F (36.6 C) 97.9 F (36.6 C) 97.9 F (36.6 C)  TempSrc: Oral Oral Oral Oral  Resp: 18 20 16 18   Height:      Weight:      SpO2: 99% 97% 96% 100%    Intake/Output Summary (Last 24 hours) at 11/25/12 1721 Last data filed at 11/25/12 1300  Gross per 24 hour  Intake   1088 ml  Output      4 ml  Net   1084 ml   Filed Weights   11/23/12 1628 11/23/12 1936  Weight: 116.983 kg (257 lb 14.4 oz) 116.756 kg (257 lb 6.4 oz)    Exam:   General:  NAD  Cardiovascular: RRR. No JVD, No LE edema  Respiratory: Clear to auscultation bilaterally. No wheezing, crackles, or rhonchi.  Abdomen: Soft,  nontender, nondistended, positive bowel sounds.  Data Reviewed: Basic Metabolic Panel:  Recent Labs Lab 11/23/12 1608 11/23/12 1737 11/24/12 0428 11/25/12 0425  NA 134*  --  138 139  K 5.4* 3.5 3.8 4.1  CL 101  --  106 104  CO2 18*  --  23 24  GLUCOSE 134*  --  116* 96  BUN 11  --  13 10  CREATININE 0.71  --  0.90 0.89  CALCIUM 9.6  --  8.9 9.6   Liver Function Tests: No results found for this basename: AST, ALT, ALKPHOS, BILITOT, PROT, ALBUMIN,  in the last 168 hours No results found for this basename: LIPASE, AMYLASE,  in the last 168 hours No results found for this basename: AMMONIA,  in the last 168 hours CBC:  Recent Labs Lab 11/23/12 1608 11/25/12 0425  WBC 8.0 4.9  NEUTROABS 5.9  --   HGB 14.3 12.3  HCT 41.4 37.6  MCV 88.7 88.1  PLT 338 305   Cardiac Enzymes: No results found for this basename: CKTOTAL, CKMB, CKMBINDEX, TROPONINI,  in the last 168 hours BNP (last 3 results) No results found for this basename: PROBNP,  in the last 8760 hours CBG: No results found for this basename: GLUCAP,  in the last 168 hours  No results found for this or any previous visit (from the past 240 hour(s)).   Studies: No results found.  Scheduled Meds: . enoxaparin (LOVENOX) injection  120  mg Subcutaneous Q12H  . warfarin  10 mg Oral ONCE-1800  . warfarin   Does not apply Once  . Warfarin - Pharmacist Dosing Inpatient   Does not apply q1800   Continuous Infusions:    Principal Problem:   Acute pulmonary embolism Active Problems:   Hyponatremia   Left leg DVT   Headache    Time spent: > 30 mins    [facility]  [facility] Pager [phone]. If 7PM-7AM, please contact night-coverage at [email or web address], password [facility] 11/25/2012, 5:21 PM  LOS: 2 days

## 2012-11-25 NOTE — Progress Notes (Signed)
ANTICOAGULATION CONSULT NOTE - Follow Up  Pharmacy Consult for Warfarin, change IV Heparin to Lovenox 3/13 Indication: Bilateral PE's, DVT  Allergies  Allergen Reactions  . Augmentin (Amoxicillin-Pot Clavulanate) Nausea And Vomiting    Patient Measurements: Height: 5\' 6"  (167.6 cm) Weight: 257 lb 6.4 oz (116.756 kg) IBW/kg (Calculated) : 59.3 Heparin Dosing Weight: 86.94 kg  Labs:  Recent Labs  11/23/12 1608 11/23/12 2145 11/24/12 0428 11/24/12 0800 11/25/12 0425  HGB 14.3  --   --   --  12.3  HCT 41.4  --   --   --  37.6  PLT 338  --   --   --  305  APTT 27  --   --   --   --   LABPROT 12.5  --  13.2  --  13.9  INR 0.94  --  1.01  --  1.08  HEPARINUNFRC  --  0.31  --  0.47 0.57  CREATININE 0.71  --  0.90  --  0.89    Estimated Creatinine Clearance: 114.6 ml/min (by C-G formula based on Cr of 0.89).   Medications:  Scheduled:  Infusions:  PRN: acetaminophen, acetaminophen, bisacodyl, ondansetron (ZOFRAN) IV, ondansetron, oxyCODONE, pseudoephedrine  Assessment:  37 yo obese F with multiple bilateral PE's.  Patient on birth control PTA, also with positive family history of clots  3/12 prelim vascular report indicates a nonocclusive DVT in leg  Today is Day #3 of 5 day minimum overlap with warfarin/heparin. Pt will need IV heparin (or SQ Lovenox) for a minimum of 5 days AND until INR > 2 x2 consecutive days.  INR is subtherapeutic as expected after 2 doses of warfarin 10mg .  To change IV heparin to SQ Lovenox today per Md order  Goal of Therapy:  Heparin level 0.3-0.7 units/ml Monitor platelets by anticoagulation protocol: Yes INR 2-3   Plan:  1)  D/C IV heparin 2) One hour later, start Lovenox 120mg  SQ q12 to continue as per overlap policy as stated above 3) Repeat warfarin 10mg  today 4) Daily INR   Hessie Knows, PharmD, BCPS Pager [phone] 11/25/2012 11:21 AM

## 2012-11-26 LAB — PROTIME-INR
INR: 1.43 (ref 0.00–1.49)
Prothrombin Time: 17.1 seconds — ABNORMAL HIGH (ref 11.6–15.2)

## 2012-11-26 LAB — BASIC METABOLIC PANEL
GFR calc Af Amer: >90 mL/min (ref >90)
GFR calc non Af Amer: 84 mL/min — ABNORMAL LOW (ref >90)
Glucose, Bld: 96 mg/dL (ref 70–99)
Potassium: 4.1 mEq/L (ref 3.5–5.1)
Sodium: 138 mEq/L (ref 135–145)

## 2012-11-26 LAB — CBC
Hemoglobin: 13.2 g/dL (ref 12.0–15.0)
MCHC: 34.1 g/dL (ref 30.0–36.0)

## 2012-11-26 MED ORDER — ENOXAPARIN SODIUM 120 MG/0.8ML ~~LOC~~ SOLN: 120.0000 mg | Freq: Two times a day (BID) | SUBCUTANEOUS | Status: DC

## 2012-11-26 MED ORDER — WARFARIN SODIUM 10 MG PO TABS
10.0000 mg | ORAL_TABLET | Freq: Once | ORAL | Status: DC
  Filled 2012-11-26: qty 1

## 2012-11-26 MED ORDER — OXYCODONE HCL 5 MG PO TABS: 5.0000 mg | ORAL_TABLET | ORAL | Status: DC | PRN

## 2012-11-26 MED ORDER — WARFARIN SODIUM 10 MG PO TABS: 10.0000 mg | ORAL_TABLET | Freq: Every day | ORAL | Status: DC

## 2012-11-26 NOTE — Progress Notes (Signed)
Patient has private insurance with BCBS with prescription coverage - benefit check for lovenox per certified medical assistant -COPAY WILL BE $84 FOR GENERIC ; $250 FOR NAME BRAND; Patient's PCP is Dr Aram Beecham [facility] - unable to make apt with Dr Cliffton Asters for Monday 11/29/2012 due to no available openings; apt made with her associate Dr Skeet Simmer for Monday 11/29/2012 at 2pm

## 2012-11-26 NOTE — Progress Notes (Signed)
Patient discharged home with family, discharge instructions given and explained to patient and she verbalized understanding, denies any pain/distress. Skin intact with bruise on right arm. Transported to the car by staff via wheelchair and accompanied home by family.

## 2012-11-26 NOTE — Progress Notes (Signed)
Patient ambulated in the hall on room air oxygen saturation 97%-100%-RA, no SOB and patient denies any distress, tolerated it well. Patient have been giving herself Lovenox and comfortable with injecting herself and Lovenox education kit was given and explained to patient and  patient verbalized understanding.

## 2012-11-26 NOTE — Progress Notes (Addendum)
ANTICOAGULATION CONSULT NOTE - Follow Up  Pharmacy Consult for Warfarin, change IV Heparin to Lovenox 3/13 Indication: Bilateral PE's, DVT  Allergies  Allergen Reactions  . Augmentin (Amoxicillin-Pot Clavulanate) Nausea And Vomiting    Patient Measurements: Height: 5\' 6"  (167.6 cm) Weight: 257 lb 6.4 oz (116.756 kg) IBW/kg (Calculated) : 59.3 Heparin Dosing Weight: 86.94 kg  Labs:  Recent Labs  11/23/12 1608 11/23/12 2145 11/24/12 0428 11/24/12 0800 11/25/12 0425 11/26/12 0445  HGB 14.3  --   --   --  12.3 13.2  HCT 41.4  --   --   --  37.6 38.7  PLT 338  --   --   --  305 324  APTT 27  --   --   --   --   --   LABPROT 12.5  --  13.2  --  13.9 17.1*  INR 0.94  --  1.01  --  1.08 1.43  HEPARINUNFRC  --  0.31  --  0.47 0.57  --   CREATININE 0.71  --  0.90  --  0.89 0.88    Estimated Creatinine Clearance: 115.9 ml/min (by C-G formula based on Cr of 0.88).   Medications:  Scheduled:  Infusions:  PRN: acetaminophen, acetaminophen, bisacodyl, ondansetron (ZOFRAN) IV, ondansetron, oxyCODONE, pseudoephedrine  Assessment:  37 yo obese F with multiple bilateral PE's.  Patient on birth control PTA, also with positive family history of clots  3/12 prelim vascular report indicates a nonocclusive DVT in leg  Today is Day #4 of 5 day minimum overlap with warfarin/heparin. Pt will need IV heparin (or SQ Lovenox) for a minimum of 5 days AND until INR > 2 x2 consecutive days.  INR is subtherapeutic but starting to respond after 10mg  x 3 doses  No reported bleeding and stable CBC  Goal of Therapy:  Heparin level 0.3-0.7 units/ml Monitor platelets by anticoagulation protocol: Yes INR 2-3   Plan:  1)  Continue Lovenox 120mg  q12 as above 2) Repeat warfarin 10mg  x 1 3) Warfarin education completed today 3/14  Hessie Knows, PharmD, BCPS Pager [phone] 11/26/2012 12:43 PM

## 2012-11-26 NOTE — Discharge Summary (Signed)
Physician Discharge Summary  Betty Perkins ZOX:[id number] DOB: 30-Oct-1975 DOA: 11/23/2012  PCP: Cala Bradford, MD  Admit date: 11/23/2012 Discharge date: 11/26/2012  Time spent: 65 minutes  Recommendations for Outpatient Follow-up:  1. Patient is to followup at PCPs office on Monday, 11/29/2012 for PT/INR check and further management of her Coumadin. Patient is being discharged on full dose Lovenox as well as Coumadin. 2. Patient is to followup with PCP one week post discharge.   Discharge Diagnoses:  Principal Problem:   Acute pulmonary embolism Active Problems:   Hyponatremia   Left leg DVT   Headache   Discharge Condition: Stable and improved  Diet recommendation: Regular  Filed Weights   11/23/12 1628 11/23/12 1936  Weight: 116.983 kg (257 lb 14.4 oz) 116.756 kg (257 lb 6.4 oz)    History of present illness:  Betty Perkins is a 37 y.o. female obese female on birth control pills, and otherwise no significant past medical history who presents with above complaints. She states that her symptoms began in February 5 when she developed soreness in her back and later that day the pain became more pleuritic. She states she has had a non-productive cough as well. She went to the walk-in clinic and was diagnosed with musculoskeletal skeletal strain, and she reports about 5 days later she was having more dyspnea on exertion so she followed back up and was in whole to see her PCP in 3 weeks if she was not improving. She continued to have shortness of breath that was worsening and reports having some mild left greater than right leg swelling that appeared to have resolved. She went to see her PCP today and a CT angiogram was ordered and showed large burden of pulmonary embolus with possible bowing of interventricular septum-nonspecific but could be seen in setting of early right-sided heart failure. She was started on heparin and is admitted for further evaluation and management. She  reports that her mother and younger brother have had blood clots. She denies any long distance travel.   Hospital Course:  1 acute PE/ left lower extremity DVT  Patient was admitted with worsening shortness of breath and some mild left greater than right lower extremity swelling. Patient saw her PCP on the day of admission CT angiogram which was ordered showed a large burden of pulmonary embolus with possible bowing of the interventricular septum. Patient was admitted to the telemetry floor and placed on IV heparin. Patient is noted to have a family history of thromboembolic disease. Patient also noted to be on oral contraceptives as well. Patient also noted to be obese. Patient's oral contraceptives were discontinued during the hospitalization. Hypercoagulable panel was also obtained, and negative for lupus anticoagulant. Patient was monitored she was placed in the pain control and supportive care. Lower extremity Dopplers also obtained which showed a left lower extremity DVT. 2-D echo which was done was negative for right ventricular strain. Patient was started on Coumadin. Patient was also subsequently transitioned off IV heparin and placed on full dose Lovenox. Patient was monitored on full dose Lovenox and Coumadin and improved clinically. Patient was ambulating the hallways with oxygen saturations greater than 95% on room air. Patient will be discharged home on full dose Lovenox as well as Coumadin until INR is therapeutic between 2-3. Patient will followup at PCPs office on Monday, 11/29/2012 for PT/INR check/Coumadin check. Patient will also followup with PCP one week post discharge. Patient will be discharged in stable and improved condition.  #2 hyponatremia  On admission patient was noted to be slightly hyponatremic with a sodium of 134. It was felt was likely secondary to volume depletion. Patient was hydrated with IV fluids with resolution of her hyponatremia.  #3 left lower lobe nodule  CT  scan which was done did show a left lower lobe nodule. Patient will need followup CT scan in 3 months    The rest of patient's chronic medical issues were stable throughout the hospitalization the patient be discharged in stable and improved condition.   Procedures: Lower extremity Dopplers 11/24/2012  2-D echo 11/24/2012  CT chest 11/23/2012   Consultations:  None  Discharge Exam: Filed Vitals:   11/25/12 1700 11/25/12 2113 11/26/12 0527 11/26/12 1500  BP:  126/78 122/78 119/85  Pulse:  68 58 66  Temp:  97.5 F (36.4 C) 98.4 F (36.9 C) 97.6 F (36.4 C)  TempSrc:  Oral Oral Oral  Resp:  18 18 18   Height:      Weight:      SpO2: 98% 100% 98% 99%    General: NAD Cardiovascular: RRR Respiratory: CTAB  Discharge Instructions      Discharge Orders   Future Orders Complete By Expires     Diet general  As directed     Discharge instructions  As directed     Comments:      Follow up at Cala Bradford, MD office on Monday 11/29/12 at 2pm for PT/INR check with Dr. Wynelle Link. Follow up with WHITE,CYNTHIA S, MD in 1 week    Increase activity slowly  As directed         Medication List    STOP taking these medications       PRESCRIPTION MEDICATION      TAKE these medications       enoxaparin 120 MG/0.8ML injection  Commonly known as:  LOVENOX  Inject 0.8 mLs (120 mg total) into the skin 2 (two) times daily.     oxyCODONE 5 MG immediate release tablet  Commonly known as:  Oxy IR/ROXICODONE  Take 1 tablet (5 mg total) by mouth every 4 (four) hours as needed.     traMADol 50 MG tablet  Commonly known as:  ULTRAM  Take 50 mg by mouth every 6 (six) hours as needed for pain.     warfarin 10 MG tablet  Commonly known as:  COUMADIN  Take 1 tablet (10 mg total) by mouth daily at 6 PM.       Follow-up Information   Follow up with Cala Bradford, MD. Schedule an appointment as soon as possible for a visit in 1 week.   Contact information:   [address]  [address] [place] [address] [phone]       Follow up with Leanor Rubenstein, MD On 11/29/2012. (F/U at 2pm for PT/INR check)    Contact information:   [address] [place] [address] [phone]        The results of significant diagnostics from this hospitalization (including imaging, microbiology, ancillary and laboratory) are listed below for reference.    Significant Diagnostic Studies: Ct Angio Chest Pe W/cm &/or Wo Cm  11/23/2012  *RADIOLOGY REPORT*  Clinical Data: Dyspnea, evaluate for pulmonary embolism  CT ANGIOGRAPHY CHEST  Technique:  Multidetector CT imaging of the chest using the standard protocol during bolus administration of intravenous contrast. Multiplanar reconstructed images including MIPs were obtained and reviewed to evaluate the vascular anatomy.  Contrast:  125 ml Omnipaque 350  Comparison: CT abdomen and  pelvis - 04/05/2004  Vascular Findings:  There is suboptimal opacification of the pulmonary arterial system with the main pulmonary artery measuring only 277 HU.  Given this limitation, there is saddle nonocclusive emboli within the right and left main pulmonary arteries.  There thrombus extending into all segmental pulmonary arteries of both lungs.  Given the suboptimal vessel opacification, evaluation for occlusive thrombus is difficult. Overall, the clot burden is large.  No evidence of pulmonary infarction.  Normal caliber of the main pulmonary artery measuring 2.9 cm in diameter.  There is a minimal bowing of the interventricular septum which may be a sign of right-sided heart failure.  There is no reflux of administer intravenous contrast into the hepatic venous system.  Normal heart size.  No pericardial effusion.  Normal caliber of the thoracic aorta.  The bovine configuration of the aortic arch is incidentally noted.  No definite thoracic aortic dissection or periaortic stranding.  -----------------------------------------------------------  Nonvascular findings:   No focal airspace opacities.  No evidence of pulmonary infarction. No pleural effusion or pneumothorax.  There is a smoothly marginated approximately 2.8 x 1.5 cm nodule within the left costophrenic angle which may be arising from the adjacent pleura.  There is no associated surrounding ground-glass or spiculation.  No mediastinal, hilar or axillary lymphadenopathy.  Limited early arterial phase evaluation of the upper abdomen is normal.  There is no definitive reflux of intravenous contrast into the hepatic venous system.  No acute or aggressive osseous abnormalities.  A bone island is incidentally noted within the inferior aspect of the T4 vertebral body.  A limbus body is noted at the T11 vertebral body.  IMPRESSION:  1.  Examination is positive for a large burden of pulmonary embolism with possible bowing of the interventricular septum, nonspecific but could be seen in the setting of early right-sided heart failure.  Further evaluation with cardiac echo may be performed as clinically indicated.  2.  Smoothly marginated approximately 2.8 cm nodule within the left costophrenic angle which may be possibly arising from the adjacent pleura.  While nonspecific, this may represent a fibrous tumor of the pleura, possibly a hamartoma. While imaging characteristics suggests a benign etiology, this nodule was not present on prior abdominal CT performed 04/05/2004.  Further evaluation may be obtained with a either a PET CT or surveillance chest CT in 3 months.  Above findings discussed Dr. Wynelle Link at [phone].   Original Report Authenticated By: Tacey Ruiz, MD     Microbiology: No results found for this or any previous visit (from the past 240 hour(s)).   Labs: Basic Metabolic Panel:  Recent Labs Lab 11/23/12 1608 11/23/12 1737 11/24/12 0428 11/25/12 0425 11/26/12 0445  NA 134*  --  138 139 138  K 5.4* 3.5 3.8 4.1 4.1  CL 101  --  106 104 104  CO2 18*  --  23 24 25   GLUCOSE 134*  --  116* 96 96  BUN 11  --   13 10 11   CREATININE 0.71  --  0.90 0.89 0.88  CALCIUM 9.6  --  8.9 9.6 9.2   Liver Function Tests: No results found for this basename: AST, ALT, ALKPHOS, BILITOT, PROT, ALBUMIN,  in the last 168 hours No results found for this basename: LIPASE, AMYLASE,  in the last 168 hours No results found for this basename: AMMONIA,  in the last 168 hours CBC:  Recent Labs Lab 11/23/12 1608 11/25/12 0425 11/26/12 0445  WBC 8.0 4.9 5.0  NEUTROABS  5.9  --   --   HGB 14.3 12.3 13.2  HCT 41.4 37.6 38.7  MCV 88.7 88.1 88.8  PLT 338 305 324   Cardiac Enzymes: No results found for this basename: CKTOTAL, CKMB, CKMBINDEX, TROPONINI,  in the last 168 hours BNP: BNP (last 3 results) No results found for this basename: PROBNP,  in the last 8760 hours CBG: No results found for this basename: GLUCAP,  in the last 168 hours     Signed:  THOMPSON,DANIEL  [facility] 11/26/2012, 3:48 PM

## 2012-12-02 ENCOUNTER — Telehealth: Payer: Self-pay | Admitting: Oncology

## 2012-12-02 NOTE — Telephone Encounter (Signed)
LEFT PT VM TO RETURN CALL IN REF TO NP APPT 12/08/12@12 :00(PER DR HA) REFERRING DR WHITE DX-PE AND DVT MAILED NP PACKET

## 2012-12-02 NOTE — Telephone Encounter (Signed)
Pt call to r/s her appt. (getting married) np appt. 12/27/12@10 :30

## 2012-12-02 NOTE — Telephone Encounter (Signed)
C/D 12/02/12 for appt. 12/08/12 °

## 2012-12-08 ENCOUNTER — Ambulatory Visit: Payer: BC Managed Care – PPO

## 2012-12-08 ENCOUNTER — Ambulatory Visit: Payer: BC Managed Care – PPO | Admitting: Oncology

## 2012-12-08 ENCOUNTER — Other Ambulatory Visit: Payer: BC Managed Care – PPO | Admitting: Lab

## 2012-12-09 ENCOUNTER — Emergency Department (HOSPITAL_COMMUNITY)
Admission: EM | Admit: 2012-12-09 | Discharge: 2012-12-10 | Disposition: A | Discharge status: Home / Self Care | Payer: BC Managed Care – PPO | Attending: Emergency Medicine | Admitting: Emergency Medicine

## 2012-12-09 ENCOUNTER — Encounter (HOSPITAL_COMMUNITY): Payer: Self-pay | Admitting: Emergency Medicine

## 2012-12-09 DIAGNOSIS — Z86711 Personal history of pulmonary embolism: Secondary | ICD-10-CM | POA: Insufficient documentation

## 2012-12-09 DIAGNOSIS — Z7901 Long term (current) use of anticoagulants: Secondary | ICD-10-CM | POA: Insufficient documentation

## 2012-12-09 DIAGNOSIS — R109 Unspecified abdominal pain: Secondary | ICD-10-CM

## 2012-12-09 DIAGNOSIS — R319 Hematuria, unspecified: Secondary | ICD-10-CM | POA: Insufficient documentation

## 2012-12-09 DIAGNOSIS — Z3202 Encounter for pregnancy test, result negative: Secondary | ICD-10-CM | POA: Insufficient documentation

## 2012-12-09 DIAGNOSIS — M549 Dorsalgia, unspecified: Secondary | ICD-10-CM | POA: Insufficient documentation

## 2012-12-09 DIAGNOSIS — Z87891 Personal history of nicotine dependence: Secondary | ICD-10-CM | POA: Insufficient documentation

## 2012-12-09 HISTORY — DX: Other pulmonary embolism without acute cor pulmonale: I26.99

## 2012-12-09 NOTE — ED Notes (Signed)
Pt states she started having back pain on Sunday and went to her PCP today and is scheduled for an [place] in the morning but the pain is too bad  Pt states her INR was at a 4 today  Pt states the pain is in her lower back and abdomen  Pt states has nausea without vomiting or diarrhea  Pt states she has taken her pain medication at home without relief

## 2012-12-10 ENCOUNTER — Other Ambulatory Visit: Payer: Self-pay | Admitting: Family Medicine

## 2012-12-10 ENCOUNTER — Ambulatory Visit
Admission: RE | Admit: 2012-12-10 | Discharge: 2012-12-10 | Disposition: A | Discharge status: Home / Self Care | Payer: BC Managed Care – PPO | Source: Ambulatory Visit | Attending: Family Medicine | Admitting: Family Medicine

## 2012-12-10 ENCOUNTER — Emergency Department (HOSPITAL_COMMUNITY): Payer: BC Managed Care – PPO

## 2012-12-10 ENCOUNTER — Encounter (HOSPITAL_COMMUNITY): Payer: Self-pay

## 2012-12-10 DIAGNOSIS — R109 Unspecified abdominal pain: Secondary | ICD-10-CM

## 2012-12-10 LAB — CBC WITH DIFFERENTIAL/PLATELET
Basophils Absolute: 0.1 10*3/uL (ref 0.0–0.1)
Basophils Relative: 1 % (ref 0–1)
MCHC: 34.5 g/dL (ref 30.0–36.0)
Monocytes Absolute: 0.6 10*3/uL (ref 0.1–1.0)
Neutro Abs: 6.3 10*3/uL (ref 1.7–7.7)
Neutrophils Relative %: 70 % (ref 43–77)
RDW: 13 % (ref 11.5–15.5)

## 2012-12-10 LAB — URINE MICROSCOPIC-ADD ON

## 2012-12-10 LAB — URINALYSIS, ROUTINE W REFLEX MICROSCOPIC
Glucose, UA: NEGATIVE mg/dL
Protein, ur: 30 mg/dL — AB
pH: 5.5 (ref 5.0–8.0)

## 2012-12-10 LAB — COMPREHENSIVE METABOLIC PANEL
AST: 19 U/L (ref 0–37)
Albumin: 3.5 g/dL (ref 3.5–5.2)
Chloride: 104 mEq/L (ref 96–112)
Creatinine, Ser: 0.87 mg/dL (ref 0.50–1.10)
Potassium: 3.6 mEq/L (ref 3.5–5.1)
Total Bilirubin: 0.2 mg/dL — ABNORMAL LOW (ref 0.3–1.2)

## 2012-12-10 LAB — PROTIME-INR
INR: 2.72 — ABNORMAL HIGH (ref 0.00–1.49)
Prothrombin Time: 27.5 seconds — ABNORMAL HIGH (ref 11.6–15.2)

## 2012-12-10 MED ORDER — HYDROMORPHONE HCL PF 1 MG/ML IJ SOLN
1.0000 mg | Freq: Once | INTRAMUSCULAR | Status: AC
  Administered 2012-12-10: 1 mg via INTRAMUSCULAR
  Filled 2012-12-10: qty 1

## 2012-12-10 NOTE — ED Provider Notes (Signed)
History     CSN: [id number]  Arrival date & time 12/09/12  2309   First MD Initiated Contact with Patient 12/10/12 [phone]      Chief Complaint  Patient presents with  . Back Pain  . Abdominal Pain   HPI  History provided by the patient. Patient is 37 year old female history of delirium was on Coumadin who presents with complaints of lower abdominal pain and discomfort. Symptoms first began on Sunday with pain radiating into her back and lower abdomen. She went to see her PCP for these symptoms earlier yesterday who had some concerns for possible biliary colic and scheduled an ultrasound. Patient was using oxycodone pain medications at home is not having significant relief. She feels pain primarily in the lower abdomen and low back area. She does report prior history of kidney stones many years ago and symptoms are slightly similar without any urinary urgency or other urinary symptoms. She denies seeing any hematuria. She denies any episodes of vomiting. No diarrhea or constipation symptoms. No fever, chills or sweats. No other aggravating or alleviating factors. No other associated symptoms.    Past Medical History  Diagnosis Date  . Medical history non-contributory   . Pulmonary embolism     Past Surgical History  Procedure Laterality Date  . Ankle surgery      right ankle  . Foot surgery      left foot    Family History  Problem Relation Age of Onset  . Hypertension Other   . Diabetes Other   . Cancer Other   . Stroke Other     History  Substance Use Topics  . Smoking status: Former Smoker    Quit date: 11/24/2006  . Smokeless tobacco: Never Used  . Alcohol Use: No    OB History   Grav Para Term Preterm Abortions TAB SAB Ect Mult Living                  Review of Systems  Constitutional: Negative for fever and chills.  Respiratory: Negative for shortness of breath.   Cardiovascular: Negative for chest pain.  Gastrointestinal: Positive for nausea and  abdominal pain. Negative for vomiting, diarrhea, constipation and blood in stool.  All other systems reviewed and are negative.    Allergies  Augmentin  Home Medications   Current Outpatient Rx  Name  Route  Sig  Dispense  Refill  . oxyCODONE (OXY IR/ROXICODONE) 5 MG immediate release tablet   Oral   Take 1 tablet (5 mg total) by mouth every 4 (four) hours as needed.   15 tablet   0   . warfarin (COUMADIN) 5 MG tablet   Oral   Take 7.5 mg by mouth daily.         . traMADol (ULTRAM) 50 MG tablet   Oral   Take 50 mg by mouth every 6 (six) hours as needed for pain.           BP 167/112  Pulse 78  Temp(Src) 98.2 F (36.8 C) (Oral)  Resp 20  SpO2 99%  LMP 11/25/2012  Physical Exam  Nursing note and vitals reviewed. Constitutional: She is oriented to person, place, and time. She appears well-developed and well-nourished. No distress.  HENT:  Head: Normocephalic.  Cardiovascular: Normal rate and regular rhythm.   Pulmonary/Chest: Effort normal and breath sounds normal. No respiratory distress.  Abdominal: Soft. There is tenderness in the right upper quadrant and epigastric area. There is no rebound, no guarding, no  CVA tenderness, no tenderness at McBurney's point and negative Murphy's sign.  Morbidly obese  Musculoskeletal: Normal range of motion.  Neurological: She is alert and oriented to person, place, and time.  Skin: Skin is warm and dry. No rash noted.  Psychiatric: She has a normal mood and affect. Her behavior is normal.    ED Course  Procedures   Results for orders placed during the hospital encounter of 12/09/12  CBC WITH DIFFERENTIAL      Result Value Range   WBC 8.9  4.0 - 10.5 K/uL   RBC 4.39  3.87 - 5.11 MIL/uL   Hemoglobin 13.4  12.0 - 15.0 g/dL   HCT 40.9  81.1 - 91.4 %   MCV 88.4  78.0 - 100.0 fL   MCH 30.5  26.0 - 34.0 pg   MCHC 34.5  30.0 - 36.0 g/dL   RDW 78.2  95.6 - 21.3 %   Platelets 309  150 - 400 K/uL   Neutrophils Relative 70   43 - 77 %   Neutro Abs 6.3  1.7 - 7.7 K/uL   Lymphocytes Relative 20  12 - 46 %   Lymphs Abs 1.8  0.7 - 4.0 K/uL   Monocytes Relative 7  3 - 12 %   Monocytes Absolute 0.6  0.1 - 1.0 K/uL   Eosinophils Relative 2  0 - 5 %   Eosinophils Absolute 0.2  0.0 - 0.7 K/uL   Basophils Relative 1  0 - 1 %   Basophils Absolute 0.1  0.0 - 0.1 K/uL  COMPREHENSIVE METABOLIC PANEL      Result Value Range   Sodium 138  135 - 145 mEq/L   Potassium 3.6  3.5 - 5.1 mEq/L   Chloride 104  96 - 112 mEq/L   CO2 24  19 - 32 mEq/L   Glucose, Bld 115 (*) 70 - 99 mg/dL   BUN 12  6 - 23 mg/dL   Creatinine, Ser 0.86  0.50 - 1.10 mg/dL   Calcium 9.3  8.4 - 57.8 mg/dL   Total Protein 7.3  6.0 - 8.3 g/dL   Albumin 3.5  3.5 - 5.2 g/dL   AST 19  0 - 37 U/L   ALT 31  0 - 35 U/L   Alkaline Phosphatase 67  39 - 117 U/L   Total Bilirubin 0.2 (*) 0.3 - 1.2 mg/dL   GFR calc non Af Amer 85 (*) >90 mL/min   GFR calc Af Amer >90  >90 mL/min  URINALYSIS, ROUTINE W REFLEX MICROSCOPIC      Result Value Range   Color, Urine YELLOW  YELLOW   APPearance CLOUDY (*) CLEAR   Specific Gravity, Urine 1.022  1.005 - 1.030   pH 5.5  5.0 - 8.0   Glucose, UA NEGATIVE  NEGATIVE mg/dL   Hgb urine dipstick LARGE (*) NEGATIVE   Bilirubin Urine NEGATIVE  NEGATIVE   Ketones, ur NEGATIVE  NEGATIVE mg/dL   Protein, ur 30 (*) NEGATIVE mg/dL   Urobilinogen, UA 0.2  0.0 - 1.0 mg/dL   Nitrite NEGATIVE  NEGATIVE   Leukocytes, UA NEGATIVE  NEGATIVE  PROTIME-INR      Result Value Range   Prothrombin Time 27.5 (*) 11.6 - 15.2 seconds   INR 2.72 (*) 0.00 - 1.49  URINE MICROSCOPIC-ADD ON      Result Value Range   Squamous Epithelial / LPF FEW (*) RARE   RBC / HPF 21-50  <3 RBC/hpf   Bacteria,  UA RARE  RARE   Urine-Other YEAST         Ct Abdomen Pelvis Wo Contrast  12/10/2012  *RADIOLOGY REPORT*  Clinical Data: Lower abdominal pain.  Back pain for 5 days. Microhematuria.  CT ABDOMEN AND PELVIS WITHOUT CONTRAST  Technique:  Multidetector  CT imaging of the abdomen and pelvis was performed following the standard protocol without intravenous contrast.  Comparison: 11/24/2006.  Findings: Unenhanced CT was performed per clinician order.  Lack of IV contrast limits sensitivity and specificity, especially for evaluation of abdominal/pelvic solid viscera.  Lung Bases: Left lower lobe pleural-based pulmonary nodule appears unchanged, measuring about 25 mm long axis.  Dependent atelectasis in the lung bases.  Liver:  Grossly normal.  Spleen:  Normal.  Gallbladder:  Normal.  Common bile duct:  8 mm distally.  This is enlarged for age.  No calcified stone is identified.  No definite intrahepatic biliary ductal dilation.  Pancreas:  Normal.  Adrenal glands:  Normal.  Kidneys:  Left kidney appears normal.  No stones or obstruction. Left ureter normal.  The right kidney demonstrates a 2 mm nonobstructing inferior pole renal calculus.  There is no hydronephrosis.  Stomach:  Normal.  Small bowel:  Normal.  Colon:   Normal appendix.  Otherwise normal.  Pelvic Genitourinary:  No free fluid.  Relatively atrophic uterus.  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 Limbus vertebra at T11.  Vasculature: Grossly normal.  IMPRESSION:  1.  Unchanged pleural bleeding nodule in the left lower lobe demonstrated on recent CT pulmonary angiogram. 2.  Mild enlargement of common bile duct.  Correlation with bilirubin recommended.  No common duct stone is identified.  If bilirubin is normal, no further evaluation is required. 3.  Nonobstructing right inferior pole renal collecting system calculus.   Original Report Authenticated By: Andreas Newport, M.D.      1. Abdominal pain   2. Hematuria       MDM  Patient seen and evaluated. Patient appears well in no significant distress or discomfort.  Lab tests unremarkable. CT scan without acute findings. Patient significantly better after medications. This time she is felt  stable for discharge home and continue to followup of symptoms.       Angus Seller, PA-C 12/10/12 [phone]

## 2012-12-10 NOTE — ED Provider Notes (Signed)
Medical screening examination/treatment/procedure(s) were performed by non-physician practitioner and as supervising physician I was immediately available for consultation/collaboration.   Rolan Bucco, MD 12/10/12 [phone]

## 2012-12-22 ENCOUNTER — Telehealth: Payer: Self-pay | Admitting: Oncology

## 2012-12-22 NOTE — Telephone Encounter (Signed)
S/w pt in re to r/s appt to 3 on 4/14 w/Dr. Gaylyn Rong.  Pt confirmed time change.

## 2012-12-23 ENCOUNTER — Encounter: Payer: Self-pay | Admitting: Oncology

## 2012-12-27 ENCOUNTER — Ambulatory Visit: Payer: BC Managed Care – PPO

## 2012-12-27 ENCOUNTER — Telehealth: Payer: Self-pay | Admitting: Oncology

## 2012-12-27 ENCOUNTER — Encounter: Payer: Self-pay | Admitting: Oncology

## 2012-12-27 ENCOUNTER — Ambulatory Visit (HOSPITAL_BASED_OUTPATIENT_CLINIC_OR_DEPARTMENT_OTHER): Payer: BC Managed Care – PPO | Admitting: Oncology

## 2012-12-27 ENCOUNTER — Other Ambulatory Visit (HOSPITAL_BASED_OUTPATIENT_CLINIC_OR_DEPARTMENT_OTHER): Payer: BC Managed Care – PPO | Admitting: Lab

## 2012-12-27 ENCOUNTER — Other Ambulatory Visit: Payer: BC Managed Care – PPO | Admitting: Lab

## 2012-12-27 ENCOUNTER — Ambulatory Visit: Payer: BC Managed Care – PPO | Admitting: Oncology

## 2012-12-27 VITALS — BP 139/85 | HR 65 | Temp 97.2°F | Resp 20 | Ht 65.5 in | Wt 267.8 lb

## 2012-12-27 DIAGNOSIS — R918 Other nonspecific abnormal finding of lung field: Secondary | ICD-10-CM

## 2012-12-27 DIAGNOSIS — I2699 Other pulmonary embolism without acute cor pulmonale: Secondary | ICD-10-CM

## 2012-12-27 DIAGNOSIS — Z7901 Long term (current) use of anticoagulants: Secondary | ICD-10-CM

## 2012-12-27 DIAGNOSIS — I824Z9 Acute embolism and thrombosis of unspecified deep veins of unspecified distal lower extremity: Secondary | ICD-10-CM

## 2012-12-27 LAB — CBC WITH DIFFERENTIAL/PLATELET
Basophils Absolute: 0 10*3/uL (ref 0.0–0.1)
Eosinophils Absolute: 0.2 10*3/uL (ref 0.0–0.5)
HCT: 38.1 % (ref 34.8–46.6)
HGB: 12.6 g/dL (ref 11.6–15.9)
LYMPH%: 28 % (ref 14.0–49.7)
MCV: 88.3 fL (ref 79.5–101.0)
MONO#: 0.4 10*3/uL (ref 0.1–0.9)
MONO%: 6.3 % (ref 0.0–14.0)
NEUT#: 4 10*3/uL (ref 1.5–6.5)
Platelets: 283 10*3/uL (ref 145–400)
WBC: 6.5 10*3/uL (ref 3.9–10.3)

## 2012-12-27 LAB — PROTIME-INR: Protime: 27.6 Seconds — ABNORMAL HIGH (ref 10.6–13.4)

## 2012-12-27 NOTE — Progress Notes (Signed)
Checked in new patients. No financial issues.

## 2012-12-27 NOTE — Progress Notes (Signed)
[facility]  [phone] Fax:([phone]     INITIAL HEMATOLOGY CONSULTATION    Referral MD:  Dr. Laurann Montana, M.D.  Reason for Referral: left lower extremity DVT and bilateral PE's.    HPI: [name] is a 37 yo woman with no significant PMH.  She presented in 11/2011 with bilateral PE's.  She was on OCP in the past which she had stopped 1 year ago when had superfical thrombophlebitis.   In Sep 2013, she resumed again OCP in the form of [employer].   On , 10/20/12:  She woke up; left lower back pain; more severe; pleurisy.  Walk in clinic; thought muscular.  She developed new symptoms of DOE; SOB.  Over the 4 ensuing weeks, she developed more progressive DOE.  CT angio showed large burden bilateral PE's.  She was on Heparin IV gtt; Coumadin.  D/C on Lovenox with Coumadin on 11/26/12. Her PCP has been checking her INR.  Her current dose of Coumadin is 10mg  (Mon/Thu) down to 7.5 the rest.  She was kindly referred to the [facility] for evaluation.   [name] presented to the clinic for the first time today by herself. Her breathing is without SOB at baseline; still DOE on walking up hill.  She used to have hematuria when first started Coumadin which as resolved. She has mild residual swelling and pain in the left calf. She recently got married and is using condom as birth control.  She would like to have children soon.  The rest of thd 14 point review of system was negative.    Past Medical History  Diagnosis Date  . Pulmonary embolism   . Superficial thrombophlebitis 12/2011  :    Past Surgical History  Procedure Laterality Date  . Ankle surgery      right ankle  . Foot surgery      left foot  :   CURRENT MEDS: Current Outpatient Prescriptions  Medication Sig Dispense Refill  . oxyCODONE (OXY IR/ROXICODONE) 5 MG immediate release tablet Take 1 tablet (5 mg total) by mouth every 4 (four) hours as needed.  15 tablet  0  .  traMADol (ULTRAM) 50 MG tablet Take 50 mg by mouth every 6 (six) hours as needed for pain.      Marland Kitchen warfarin (COUMADIN) 5 MG tablet Take 7.5 mg by mouth daily.       No current facility-administered medications for this visit.      Allergies  Allergen Reactions  . Augmentin (Amoxicillin-Pot Clavulanate) Nausea And Vomiting  :  Family History  Problem Relation Age of Onset  . Deep vein thrombosis Other   . Deep vein thrombosis Mother   . Hypertension Mother   . Deep vein thrombosis Father   . Hypertension Father   . Deep vein thrombosis Brother   . Deep vein thrombosis Maternal Aunt   . Stroke Maternal Grandmother   . Diabetes Maternal Grandmother   . Hypertension Maternal Grandmother   . Cancer Maternal Grandfather     lung  . Cancer Paternal Grandfather     head/neck  :  History   Social History  . Marital Status: Married    Spouse Name: N/A    Number of Children: 0  . Years of Education: N/A   Occupational History  .      manicurist.    Social History Main Topics  . Smoking status: Former Smoker -- 1.00 packs/day for 10 years  Quit date: 11/24/2006  . Smokeless tobacco: Never Used  . Alcohol Use: No  . Drug Use: No  . Sexually Active: Not on file   Other Topics Concern  . Not on file   Social History Narrative  . No narrative on file  :  REVIEW OF SYSTEM:  The rest of the 14-point review of sytem was negative.   Exam: ECOG 0.   General:  Mildly obese woman,  in no acute distress.  Eyes:  no scleral icterus.  ENT:  There were no oropharyngeal lesions.  Neck was without thyromegaly.  Lymphatics:  Negative cervical, supraclavicular or axillary adenopathy.  Respiratory: lungs were clear bilaterally without wheezing or crackles.  Cardiovascular:  Regular rate and rhythm, S1/S2, without murmur, rub or gallop.  There was no pedal edema.  GI:  abdomen was soft, flat, nontender, nondistended, without organomegaly.  Muscoloskeletal:  no spinal tenderness of  palpation of vertebral spine.  Skin exam was without echymosis, petichae.  Neuro exam was nonfocal.  Patient was able to get on and off exam table without assistance.  Gait was normal.  Patient was alert and oriented.  Attention was good.   Language was appropriate.  Mood was normal without depression.  Speech was not pressured.  Thought content was not tangential.    LABS:  Lab Results  Component Value Date   WBC 6.5 12/27/2012   HGB 12.6 12/27/2012   HCT 38.1 12/27/2012   PLT 283 12/27/2012   GLUCOSE 115* 12/10/2012   ALT 31 12/10/2012   AST 19 12/10/2012   NA 138 12/10/2012   K 3.6 12/10/2012   CL 104 12/10/2012   CREATININE 0.87 12/10/2012   BUN 12 12/10/2012   CO2 24 12/10/2012   INR 2.30 12/27/2012     ASSESSMENT AND PLAN:   1.  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 - Most likely it was provoked since she was on birth control.  Her thrombophilia work up was negative.  However, given large clot burden and extensive family history of DVT's and PE's, I recommended life long anticoagulation.  In the future, if her personal risk of bleeding outweigh the potential benefit of chronic anticoagulation to decrease the risk of recurrent DVT/PE, then this question can be readdressed.  [name] agreed with this and want to have life long anticoagulation. I asked her to provide [place] with there result of her family testing for thromophilia.   2.  Type of anticoag: We discussed the pros and cons of Coumadin vs Xarelto.  She would like to stay with Coumadin for now due to cost issue.    3.  Monitoring: she would like to continue INR monitoring with Dr. Cliffton Asters.  Goal INR is 2-3.  4.  Patient educations: - Diet, green leafy vegetables:  Should be stable amount to achieve stable INR.  - Antibiotics can increase INR and risk of bleeding.   - Delay getting pregnant for at least one year since she has large PE's and still has residual DOE.  - As soon as she is pregnant, she should change from Coumadin to  Lovenox due to Coumadin's risk of fetal malformation. - Birth control:  Avoid estrogen containing OCP's.  I advised her to talk with her PCP and gynecologist re: options.   5.  Left costophrenic lung nodule found incidentally on CT angio:  I reorder CT chest with contrast in about 2 months for follow up.     The length of time of  the face-to-face encounter was 45 minutes. More than 50% of time was spent counseling and coordination of care.     Thank you for this referral.

## 2012-12-27 NOTE — Telephone Encounter (Signed)
gv pt appt schedule for June. Per 4/14 pof ct to be done mid June prior to f/u. Betty Perkins in central made aware ct should be in June prior to f/u - expected date on order is 12/27/12. Pt aware central will contact her re ct and that it is to be done in June.

## 2012-12-28 ENCOUNTER — Encounter: Payer: Self-pay | Admitting: Oncology

## 2012-12-28 NOTE — Progress Notes (Signed)
Please see visit note same day.

## 2012-12-29 LAB — PROTHROMBIN GENE MUTATION

## 2013-02-21 ENCOUNTER — Encounter (HOSPITAL_COMMUNITY): Payer: Self-pay

## 2013-02-21 ENCOUNTER — Ambulatory Visit (HOSPITAL_COMMUNITY)
Admission: RE | Admit: 2013-02-21 | Discharge: 2013-02-21 | Disposition: A | Discharge status: Home / Self Care | Payer: BC Managed Care – PPO | Source: Ambulatory Visit | Attending: Oncology | Admitting: Oncology

## 2013-02-21 DIAGNOSIS — I2699 Other pulmonary embolism without acute cor pulmonale: Secondary | ICD-10-CM

## 2013-02-21 DIAGNOSIS — R918 Other nonspecific abnormal finding of lung field: Secondary | ICD-10-CM | POA: Insufficient documentation

## 2013-02-21 MED ORDER — IOHEXOL 300 MG/ML  SOLN
80.0000 mL | Freq: Once | INTRAMUSCULAR | Status: AC | PRN
  Administered 2013-02-21: 80 mL via INTRAVENOUS

## 2013-02-24 ENCOUNTER — Other Ambulatory Visit: Payer: Self-pay | Admitting: Oncology

## 2013-02-24 ENCOUNTER — Encounter: Payer: Self-pay | Admitting: Oncology

## 2013-02-24 ENCOUNTER — Telehealth: Payer: Self-pay | Admitting: *Deleted

## 2013-02-24 DIAGNOSIS — R911 Solitary pulmonary nodule: Secondary | ICD-10-CM

## 2013-02-24 NOTE — Telephone Encounter (Signed)
Pt left VM asking about results of her CT scan.  States Dr. Gaylyn Rong told her if scan ok then she will not need to keep f/u visit on 6/16.

## 2013-02-25 ENCOUNTER — Telehealth: Payer: Self-pay | Admitting: Oncology

## 2013-02-25 ENCOUNTER — Telehealth: Payer: Self-pay | Admitting: *Deleted

## 2013-02-25 NOTE — Telephone Encounter (Signed)
Pt called to let Dr. Gaylyn Rong know she got his message and she has scheduled her CT scan.  She wants to know if her recent scan showed if her blood clots were getting any smaller?

## 2013-02-25 NOTE — Telephone Encounter (Signed)
lvm for pt regarding to 6.16.14 appt being cancelled due to being discharged from the clinic...advised pt the cs will contact with d/t for ct.

## 2013-02-25 NOTE — Telephone Encounter (Signed)
Please explain to her that the last chest CT was for the chest/lung nodule and not for the PE (that would be CTangiogram).  Regardless, there was no massive clots that was obvious on CT chest.

## 2013-02-28 ENCOUNTER — Ambulatory Visit: Payer: BC Managed Care – PPO | Admitting: Oncology

## 2013-02-28 ENCOUNTER — Telehealth: Payer: Self-pay | Admitting: *Deleted

## 2013-02-28 NOTE — Telephone Encounter (Signed)
Returned pt's call from Friday.  She asked if CT scan showed if her Blood clots were any smaller?  Explained, per Dr. Gaylyn Rong,  The CT was for the nodule and not specific for the clots.  It did not show any obvious clots, but those don't always show up on regular CT scan.  CT angiogram is more specific for clots.  If pt has recurrent symptoms of sob or chest pain, then that type of CT may be ordered again, but not indicated at this time.  She verbalized understanding.

## 2013-04-29 ENCOUNTER — Emergency Department (HOSPITAL_COMMUNITY): Payer: BC Managed Care – PPO

## 2013-04-29 ENCOUNTER — Telehealth: Payer: Self-pay | Admitting: *Deleted

## 2013-04-29 ENCOUNTER — Emergency Department (HOSPITAL_COMMUNITY)
Admission: EM | Admit: 2013-04-29 | Discharge: 2013-04-29 | Disposition: A | Discharge status: Home / Self Care | Payer: BC Managed Care – PPO | Attending: Emergency Medicine | Admitting: Emergency Medicine

## 2013-04-29 ENCOUNTER — Encounter (HOSPITAL_COMMUNITY): Payer: Self-pay

## 2013-04-29 DIAGNOSIS — Z86711 Personal history of pulmonary embolism: Secondary | ICD-10-CM | POA: Insufficient documentation

## 2013-04-29 DIAGNOSIS — R791 Abnormal coagulation profile: Secondary | ICD-10-CM

## 2013-04-29 DIAGNOSIS — R0789 Other chest pain: Secondary | ICD-10-CM | POA: Insufficient documentation

## 2013-04-29 DIAGNOSIS — R110 Nausea: Secondary | ICD-10-CM | POA: Insufficient documentation

## 2013-04-29 DIAGNOSIS — Z8679 Personal history of other diseases of the circulatory system: Secondary | ICD-10-CM | POA: Insufficient documentation

## 2013-04-29 DIAGNOSIS — Z86718 Personal history of other venous thrombosis and embolism: Secondary | ICD-10-CM | POA: Insufficient documentation

## 2013-04-29 DIAGNOSIS — Z79899 Other long term (current) drug therapy: Secondary | ICD-10-CM | POA: Insufficient documentation

## 2013-04-29 DIAGNOSIS — R0609 Other forms of dyspnea: Secondary | ICD-10-CM | POA: Insufficient documentation

## 2013-04-29 DIAGNOSIS — Z3202 Encounter for pregnancy test, result negative: Secondary | ICD-10-CM | POA: Insufficient documentation

## 2013-04-29 DIAGNOSIS — R0989 Other specified symptoms and signs involving the circulatory and respiratory systems: Secondary | ICD-10-CM | POA: Insufficient documentation

## 2013-04-29 DIAGNOSIS — R0600 Dyspnea, unspecified: Secondary | ICD-10-CM

## 2013-04-29 DIAGNOSIS — Z87891 Personal history of nicotine dependence: Secondary | ICD-10-CM | POA: Insufficient documentation

## 2013-04-29 DIAGNOSIS — R7989 Other specified abnormal findings of blood chemistry: Secondary | ICD-10-CM | POA: Insufficient documentation

## 2013-04-29 HISTORY — DX: Acute embolism and thrombosis of unspecified deep veins of unspecified lower extremity: I82.409

## 2013-04-29 LAB — CBC WITH DIFFERENTIAL/PLATELET
Eosinophils Absolute: 0.1 10*3/uL (ref 0.0–0.7)
Eosinophils Relative: 1 % (ref 0–5)
HCT: 40.3 % (ref 36.0–46.0)
Hemoglobin: 13.7 g/dL (ref 12.0–15.0)
Lymphocytes Relative: 16 % (ref 12–46)
Lymphs Abs: 1.6 10*3/uL (ref 0.7–4.0)
MCH: 29.9 pg (ref 26.0–34.0)
MCV: 88 fL (ref 78.0–100.0)
Monocytes Relative: 6 % (ref 3–12)
Platelets: 309 10*3/uL (ref 150–400)
RBC: 4.58 MIL/uL (ref 3.87–5.11)
WBC: 9.5 10*3/uL (ref 4.0–10.5)

## 2013-04-29 LAB — BASIC METABOLIC PANEL
CO2: 19 mEq/L (ref 19–32)
Chloride: 104 mEq/L (ref 96–112)
Glucose, Bld: 93 mg/dL (ref 70–99)
Potassium: 3.8 mEq/L (ref 3.5–5.1)
Sodium: 136 mEq/L (ref 135–145)

## 2013-04-29 LAB — TROPONIN I: Troponin I: <0.3 ng/mL (ref <0.30)

## 2013-04-29 LAB — POCT PREGNANCY, URINE: Preg Test, Ur: NEGATIVE

## 2013-04-29 MED ORDER — IOHEXOL 350 MG/ML SOLN
100.0000 mL | Freq: Once | INTRAVENOUS | Status: AC | PRN
  Administered 2013-04-29: 100 mL via INTRAVENOUS

## 2013-04-29 MED ORDER — SODIUM CHLORIDE 0.9 % IV BOLUS (SEPSIS)
500.0000 mL | Freq: Once | INTRAVENOUS | Status: AC
  Administered 2013-04-29: 500 mL via INTRAVENOUS

## 2013-04-29 NOTE — ED Notes (Signed)
Patient transported to CT 

## 2013-04-29 NOTE — ED Provider Notes (Signed)
CSN: [id number]     Arrival date & time 04/29/13  1542 History     First MD Initiated Contact with Patient 04/29/13 1550     Chief Complaint  Patient presents with  . Shortness of Breath   (Consider location/radiation/quality/duration/timing/severity/associated sxs/prior Treatment) Patient is a 37 y.o. female presenting with shortness of breath. The history is provided by the patient.  Shortness of Breath Severity:  Moderate Onset quality:  Gradual Duration:  2 days Timing:  Constant Progression:  Unchanged Chronicity:  New Context: activity   Relieved by:  Rest Worsened by:  Activity Associated symptoms: chest pain (has felt some sharp pain to lower left axillary chest over past month)   Associated symptoms: no abdominal pain, no cough, no fever, no sputum production, no vomiting and no wheezing   Risk factors: hx of PE/DVT     Past Medical History  Diagnosis Date  . Pulmonary embolism   . Superficial thrombophlebitis 12/2011  . DVT (deep venous thrombosis)    Past Surgical History  Procedure Laterality Date  . Ankle surgery      right ankle  . Foot surgery      left foot   Family History  Problem Relation Age of Onset  . Deep vein thrombosis Other   . Deep vein thrombosis Mother   . Hypertension Mother   . Deep vein thrombosis Father   . Hypertension Father   . Deep vein thrombosis Brother   . Deep vein thrombosis Maternal Aunt   . Stroke Maternal Grandmother   . Diabetes Maternal Grandmother   . Hypertension Maternal Grandmother   . Cancer Maternal Grandfather     lung  . Cancer Paternal Grandfather     head/neck   History  Substance Use Topics  . Smoking status: Former Smoker -- 1.00 packs/day for 10 years    Quit date: 11/24/2006  . Smokeless tobacco: Never Used  . Alcohol Use: No   OB History   Grav Para Term Preterm Abortions TAB SAB Ect Mult Living                 Review of Systems  Constitutional: Negative for fever.  Respiratory:  Positive for shortness of breath. Negative for cough, sputum production and wheezing.   Cardiovascular: Positive for chest pain (has felt some sharp pain to lower left axillary chest over past month). Negative for leg swelling.  Gastrointestinal: Positive for nausea (has felt nauseous for a few days). Negative for vomiting and abdominal pain.  Musculoskeletal: Negative for back pain.  All other systems reviewed and are negative.    Allergies  Augmentin  Home Medications   Current Outpatient Rx  Name  Route  Sig  Dispense  Refill  . oxyCODONE (OXY IR/ROXICODONE) 5 MG immediate release tablet   Oral   Take 1 tablet (5 mg total) by mouth every 4 (four) hours as needed.   15 tablet   0   . traMADol (ULTRAM) 50 MG tablet   Oral   Take 50 mg by mouth every 6 (six) hours as needed for pain.         Marland Kitchen warfarin (COUMADIN) 5 MG tablet   Oral   Take 7.5 mg by mouth daily.          BP 141/96  Pulse 85  Temp(Src) 98.3 F (36.8 C) (Oral)  Resp 18  Ht 5\' 6"  (1.676 m)  Wt 280 lb (127.007 kg)  BMI 45.21 kg/m2  SpO2 100%  LMP 04/10/2013  Physical Exam  Vitals reviewed. Constitutional: She is oriented to person, place, and time. She appears well-developed and well-nourished.  obese  HENT:  Head: Normocephalic and atraumatic.  Right Ear: External ear normal.  Left Ear: External ear normal.  Nose: Nose normal.  Eyes: Right eye exhibits no discharge. Left eye exhibits no discharge.  Cardiovascular: Normal rate, regular rhythm and normal heart sounds.   Pulmonary/Chest: Effort normal and breath sounds normal. She has no wheezes. She has no rales. She exhibits no tenderness.  Abdominal: Soft. There is no tenderness.  Musculoskeletal: She exhibits no edema and no tenderness.  Neurological: She is alert and oriented to person, place, and time.  Skin: Skin is warm and dry.    ED Course   Procedures (including critical care time)  Labs Reviewed  PROTIME-INR - Abnormal; Notable  for the following:    Prothrombin Time 16.9 (*)    All other components within normal limits  BASIC METABOLIC PANEL  CBC WITH DIFFERENTIAL  TROPONIN I  POCT PREGNANCY, URINE    Date: 04/29/2013  Rate: 87  Rhythm: normal sinus rhythm  QRS Axis: left  Intervals: normal  ST/T Wave abnormalities: normal  Conduction Disutrbances:none  Narrative Interpretation: No ischemia  Old EKG Reviewed: unchanged   Dg Chest 2 View  04/29/2013   *RADIOLOGY REPORT*  Clinical Data: Shortness of breath  CHEST - 2 VIEW  Comparison: Chest CT February 21, 2013  Findings: There is no edema or consolidation.  Heart size and pulmonary vascularity are normal.  No adenopathy.  No bone lesions.  IMPRESSION: No edema or consolidation.  Opacity seen in the left base on CT examination is not seen on this study.  It would be prudent to continue CT surveillance of this nodular lesion given its appearance on recent prior study, however.   Original Report Authenticated By: Bretta Bang, M.D.   Ct Angio Chest Pe W/cm &/or Wo Cm  04/29/2013   *RADIOLOGY REPORT*  Clinical Data: Dyspnea  CT ANGIOGRAPHY CHEST  Technique:  Multidetector CT imaging of the chest using the standard protocol during bolus administration of intravenous contrast. Multiplanar reconstructed images including MIPs were obtained and reviewed to evaluate the vascular anatomy.  Contrast: OMNIPAQUE IOHEXOL 350 MG/ML SOLN  Comparison: Chest radiograph April 29, 2013 and CT angiogram chest February 21, 2013  Findings: There is no demonstrable pulmonary embolus.  There is no thoracic aortic aneurysm or dissection.  The nodular lesion in the posterior left base is seen on slice 68. It currently measures 12 x 7 mm, perhaps minimally decreased compared the previous study.  Elsewhere, the lungs are clear. There is no appreciable thoracic adenopathy.  The pericardium is not thickened.  Visualized upper abdominal structures appear normal.  There are no blastic or lytic bone  lesions.  IMPRESSION: Nodular lesion posterior left base remains, although it may be marginally smaller compared to prior study. Followup study in approximately 6 months to assess for stability may remains warranted.  There is no frank edema or consolidation.  No demonstrable pulmonary embolus.   Original Report Authenticated By: Bretta Bang, M.D.   1. Exertional dyspnea   2. Subtherapeutic international normalized ratio (INR)     MDM  37 year old female with history of PE 5 months ago presents with little worsening of dyspnea on exertion for the past few days. Asymptomatic currently while at rest. No active chest pain. EKG with old LAD but otherwise normal and not c/w ischemia. As her INR was subtherapeutic a CT  was evaluated to r/o new PE, is neg for PE, PNA, and other acute pathology. She feels well currently and will f/u with PCP for further outpatient w/u if her sx return.   Audree Camel, MD 04/30/13 [phone]

## 2013-04-29 NOTE — ED Notes (Signed)
MD at bedside. 

## 2013-04-29 NOTE — Telephone Encounter (Signed)
Pt called to report increase in sob over past day.  SOB w/ ADLs like bathing and now just walking down the hall.  She states felt like this when she had pulmonary embolus in the past, but it got better.  She is currently on coumadin being managed by Dr. Cliffton Asters.  She states she notified their office this morning, but was not given any new instructions. Instructed pt to go to ED for SOB to evaluate SOB.   She verbalized understanding, states will go to ED.

## 2013-04-29 NOTE — ED Notes (Signed)
Pt states she has had shortness of breath since yesterday. Pt states she gets short of breath with diaphoresis upon exertion since yesterday. Pt reports she has a hx of bilateral PEs in March. States she is currently on coumadin. Pt denies any pain at present, but states she has had some intermittent L sided rib pain and some pain to R upper chest for the past month. Pt also reports that she had a superficial blood clot behind her R knee about 3 weeks ago. Pt arrives to exam room in wheelchair. Pt with no acute distress. Breaths even/unlabored. Skin warm,dry. Pt arrives with family member.

## 2013-05-06 ENCOUNTER — Encounter: Payer: Self-pay | Admitting: Internal Medicine

## 2013-05-06 ENCOUNTER — Ambulatory Visit (INDEPENDENT_AMBULATORY_CARE_PROVIDER_SITE_OTHER): Payer: BC Managed Care – PPO | Admitting: Internal Medicine

## 2013-05-06 VITALS — BP 138/90 | HR 107 | Temp 98.9°F | Ht 66.0 in | Wt 285.0 lb

## 2013-05-06 DIAGNOSIS — R0600 Dyspnea, unspecified: Secondary | ICD-10-CM | POA: Insufficient documentation

## 2013-05-06 DIAGNOSIS — R0989 Other specified symptoms and signs involving the circulatory and respiratory systems: Secondary | ICD-10-CM

## 2013-05-06 DIAGNOSIS — I2699 Other pulmonary embolism without acute cor pulmonale: Secondary | ICD-10-CM

## 2013-05-06 DIAGNOSIS — R0609 Other forms of dyspnea: Secondary | ICD-10-CM

## 2013-05-06 NOTE — Patient Instructions (Addendum)
Weight control is simply a matter of calorie balance which needs to be tilted in your favor by eating less and exercising more.  To get the most out of exercise, you need to be continuously aware that you are slightly short of breath, but never out of breath, for 20- 30 minutes daily. As you improve, it will actually be easier for you to do the same amount of exercise  in  30 minutes so always push to the level where you are short of breath.  If this does not result in gradual weight reduction then I strongly recommend you see a nutritionist with a food diary x 2 weeks so that we can work out a negative calorie balance which is universally effective in steady weight loss programs.  Think of your calorie balance like you do your bank account where in this case you want the balance to go down so you must take in less calories than you burn up.  It's just that simple:  Hard to do, but easy to understand.  Good luck!   Please see patient coordinator before you leave today  to schedule echo cardiogram  Please schedule a follow up office visit in 4 weeks, call sooner if needed with pfts on return

## 2013-05-06 NOTE — Progress Notes (Signed)
Subjective:    Patient ID: Betty Perkins, female    DOB: 10/13/1975   MRN: [id number]  HPI  37 yowf quit smoking 2008 fine at that point but  referred to pulmonary clinic 05/06/2013 by Laurann Montana for eval of sob s/p PE March 2014  05/06/2013 1st [facility] office visit/ Marcedes Tech cc acute L post cp late Feb 2014 then sob >      Admit date: 11/23/2012  Discharge date: 11/26/2012     Recommendations for Outpatient Follow-up:  1. Patient is to followup at PCPs office on Monday, 11/29/2012 for PT/INR check and further management of her Coumadin. Patient is being discharged on full dose Lovenox as well as Coumadin. 2. Patient is to followup with PCP one week post discharge.  Discharge Diagnoses:  Principal Problem:  Acute pulmonary embolism  Active Problems:  Hyponatremia  Left leg DVT  Headache  Discharge Condition: Stable and improved  Diet recommendation: Regular  Filed Weights    11/23/12 1628  11/23/12 1936   Weight:  116.983 kg (257 lb 14.4 oz)  116.756 kg (257 lb 6.4 oz)   History of present illness:  [name] is a 37 y.o. female obese female on birth control pills, and otherwise no significant past medical history who presents with above complaints. She states that her symptoms began in February 5 when she developed soreness in her back and later that day the pain became more pleuritic. She states she has had a non-productive cough as well. She went to the walk-in clinic and was diagnosed with musculoskeletal skeletal strain, and she reports about 5 days later she was having more dyspnea on exertion so she followed back up and was in whole to see her PCP in 3 weeks if she was not improving. She continued to have shortness of breath that was worsening and reports having some mild left greater than right leg swelling that appeared to have resolved. She went to see her PCP today and a CT angiogram was ordered and showed large burden of pulmonary embolus with possible  bowing of interventricular septum-nonspecific but could be seen in setting of early right-sided heart failure. She was started on heparin and is admitted for further evaluation and management. She reports that her mother and younger brother have had blood clots. She denies any long distance travel.  Hospital Course:  1 acute PE/ left lower extremity DVT  Patient was admitted with worsening shortness of breath and some mild left greater than right lower extremity swelling. Patient saw her PCP on the day of admission CT angiogram which was ordered showed a large burden of pulmonary embolus with possible bowing of the interventricular septum. Patient was admitted to the telemetry floor and placed on IV heparin. Patient is noted to have a family history of thromboembolic disease. Patient also noted to be on oral contraceptives as well. Patient also noted to be obese. Patient's oral contraceptives were discontinued during the hospitalization. Hypercoagulable panel was also obtained, and negative for lupus anticoagulant. Patient was monitored she was placed in the pain control and supportive care. Lower extremity Dopplers also obtained which showed a left lower extremity DVT. 2-D echo which was done was negative for right ventricular strain. Patient was started on Coumadin. Patient was also subsequently transitioned off IV heparin and placed on full dose Lovenox. Patient was monitored on full dose Lovenox and Coumadin and improved clinically. Patient was ambulating the hallways with oxygen saturations greater than 95% on room air. Patient will  be discharged home on full dose Lovenox as well as Coumadin until INR is therapeutic between 2-3. Patient will followup at PCPs office on Monday, 11/29/2012 for PT/INR check/Coumadin check. Patient will also followup with PCP one week post discharge. Patient will be discharged in stable and improved condition.  #2 hyponatremia  On admission patient was noted to be slightly  hyponatremic with a sodium of 134. It was felt was likely secondary to volume depletion. Patient was hydrated with IV fluids with resolution of her hyponatremia.  #3 left lower lobe nodule  CT scan which was done did show a left lower lobe nodule. Patient will need followup CT scan in 3 months   After discharge saw heme/onc [facility] > rec lifelong coumadin based on clot burden and fm hx of dvt, though her w/u for hypercoagulability was negative   05/06/2013 1st [facility] office visit/ Mykel Sponaugle cc doe ever since Feb 2014 that never really improved eg one flight of steps and stops at top and walking x one quarter miled then 8/14 sob taking a shower assoc with new L flank not worse with deep breath, def worse lying L side down> reCT neg for PE.  No resting symptoms or cough, presyncope or leg swelling.  No obvious daytime variabilty or assoc chest tightness, subjective wheeze overt sinus or hb symptoms. No unusual exp hx or h/o childhood pna/ asthma or knowledge of premature birth.   Sleeping ok without nocturnal  or early am exacerbation  of respiratory  c/o's or need for noct saba. Also denies any obvious fluctuation of symptoms with weather or environmental changes or other aggravating or alleviating factors except as outlined above    Review of Systems  Constitutional: Negative for fever, chills, diaphoresis, activity change, appetite change, fatigue and unexpected weight change.  HENT: Negative for hearing loss, ear pain, nosebleeds, congestion, sore throat, facial swelling, rhinorrhea, sneezing, mouth sores, trouble swallowing, neck pain, neck stiffness, dental problem, voice change, postnasal drip, sinus pressure, tinnitus and ear discharge.   Eyes: Negative for photophobia, discharge, itching and visual disturbance.  Respiratory: Positive for shortness of breath. Negative for apnea, cough, choking, chest tightness, wheezing and stridor.   Cardiovascular: Positive for chest pain. Negative for  palpitations and leg swelling.  Gastrointestinal: Negative for nausea, vomiting, abdominal pain, constipation, blood in stool and abdominal distention.  Genitourinary: Negative for dysuria, urgency, frequency, hematuria, flank pain, decreased urine volume and difficulty urinating.  Musculoskeletal: Positive for joint swelling. Negative for myalgias, back pain, arthralgias and gait problem.  Skin: Negative for color change, pallor and rash.       Itching  Neurological: Negative for dizziness, tremors, seizures, syncope, speech difficulty, weakness, light-headedness, numbness and headaches.  Hematological: Negative for adenopathy. Does not bruise/bleed easily.  Psychiatric/Behavioral: Negative for confusion, sleep disturbance and agitation. The patient is not nervous/anxious.        Objective:   Physical Exam    Wt Readings from Last 3 Encounters:  05/06/13 285 lb (129.275 kg)  04/29/13 280 lb (127.007 kg)  12/27/12 267 lb 12.8 oz (121.473 kg)    amb pleasant obese wf nad   HEENT: nl dentition, turbinates, and orophanx. Nl external ear canals without cough reflex   NECK :  without JVD/Nodes/TM/ nl carotid upstrokes bilaterally   LUNGS: no acc muscle use, clear to A and P bilaterally without cough on insp or exp maneuvers   CV:  RRR  no s3 or murmur   No increase P2, no peripheral edema  ABD:  soft and nontender with nl excursion in the supine position. No bruits or organomegaly, bowel sounds nl  MS:  warm without deformities, calf tenderness, cyanosis or clubbing  SKIN: warm and dry without lesions    NEURO:  alert, approp, no deficits    CT 04/29/13 Nodular lesion posterior left base remains, although it may be  marginally smaller compared to prior study. Followup study in  approximately 6 months to assess for stability may remains  warranted.  There is no frank edema or consolidation. No demonstrable  pulmonary embolus            Assessment & Plan:

## 2013-05-07 NOTE — Assessment & Plan Note (Addendum)
Onset likely Feb 2014 - Heme/onc w/u for hypercoagulability neg (see Ha note 12/27/12) - Repeat CTchest neg 04/29/13   Despite neg w/u for hypercoagulability, I agree with Heme/Onc opinion for indefinite rx based on obesity, hopefully giving her an obtainable goal to one day stop the coumadin.  This episode occurred while morbidly obese on BCP's assoc with dvt, so if she lost wt down to a BMI of < 30 and her venous dopplers were neg I would stop the coumadin regardless of previous clot burden, which in the age of CT always looks worse than it really is and is better reflected in RV function, which was minimally affected at the time and needs to be repeated now that the clots are gone to be sure she doesn't have PAH (see a/p under dyspnea).  For now though I note INR's have been on the low side and need to be more in the 2.5-4 range given her persistent issue with morbid obesity and relatively high risk of recurrence

## 2013-05-07 NOTE — Assessment & Plan Note (Signed)
-   05/06/2013  Walked RA x 3 laps @ 185 ft each stopped due to  85% p 2 laps - 2 d echo 05/06/2013 >>  Wt Readings from Last 3 Encounters:  05/06/13 285 lb (129.275 kg)  04/29/13 280 lb (127.007 kg)  12/27/12 267 lb 12.8 oz (121.473 kg)     She has gained almost 20 lbs since her breathing again deteriorated and most likely the doe is  due to poor aeration in bases leading to V/q mismatch but this doesn't typically cause desats so need to look again at [facility] to see if she is developing PAH or shunting.

## 2013-05-23 ENCOUNTER — Encounter: Payer: Self-pay | Admitting: Internal Medicine

## 2013-05-23 ENCOUNTER — Ambulatory Visit (HOSPITAL_COMMUNITY): Payer: BC Managed Care – PPO | Attending: Cardiology

## 2013-05-23 DIAGNOSIS — I080 Rheumatic disorders of both mitral and aortic valves: Secondary | ICD-10-CM | POA: Insufficient documentation

## 2013-05-23 DIAGNOSIS — R0609 Other forms of dyspnea: Secondary | ICD-10-CM | POA: Insufficient documentation

## 2013-05-23 DIAGNOSIS — I079 Rheumatic tricuspid valve disease, unspecified: Secondary | ICD-10-CM | POA: Insufficient documentation

## 2013-05-23 DIAGNOSIS — R072 Precordial pain: Secondary | ICD-10-CM

## 2013-05-23 DIAGNOSIS — Z86711 Personal history of pulmonary embolism: Secondary | ICD-10-CM | POA: Insufficient documentation

## 2013-05-23 DIAGNOSIS — Z86718 Personal history of other venous thrombosis and embolism: Secondary | ICD-10-CM | POA: Insufficient documentation

## 2013-05-23 DIAGNOSIS — R0600 Dyspnea, unspecified: Secondary | ICD-10-CM

## 2013-05-23 DIAGNOSIS — E669 Obesity, unspecified: Secondary | ICD-10-CM | POA: Insufficient documentation

## 2013-05-23 DIAGNOSIS — R0989 Other specified symptoms and signs involving the circulatory and respiratory systems: Secondary | ICD-10-CM | POA: Insufficient documentation

## 2013-05-23 DIAGNOSIS — R0602 Shortness of breath: Secondary | ICD-10-CM

## 2013-05-23 DIAGNOSIS — I379 Nonrheumatic pulmonary valve disorder, unspecified: Secondary | ICD-10-CM | POA: Insufficient documentation

## 2013-05-23 DIAGNOSIS — Z87891 Personal history of nicotine dependence: Secondary | ICD-10-CM | POA: Insufficient documentation

## 2013-05-23 NOTE — Progress Notes (Signed)
Quick Note:  Spoke with pt and notified of results per Dr. Wert. Pt verbalized understanding and denied any questions.  ______ 

## 2013-05-23 NOTE — Progress Notes (Signed)
Echocardiogram performed.  

## 2013-06-20 ENCOUNTER — Ambulatory Visit (INDEPENDENT_AMBULATORY_CARE_PROVIDER_SITE_OTHER): Payer: BC Managed Care – PPO | Admitting: Internal Medicine

## 2013-06-20 ENCOUNTER — Encounter: Payer: Self-pay | Admitting: Internal Medicine

## 2013-06-20 VITALS — BP 126/94 | HR 92 | Ht 66.0 in | Wt 292.0 lb

## 2013-06-20 DIAGNOSIS — R0989 Other specified symptoms and signs involving the circulatory and respiratory systems: Secondary | ICD-10-CM

## 2013-06-20 DIAGNOSIS — R0600 Dyspnea, unspecified: Secondary | ICD-10-CM

## 2013-06-20 DIAGNOSIS — R0609 Other forms of dyspnea: Secondary | ICD-10-CM

## 2013-06-20 DIAGNOSIS — I2699 Other pulmonary embolism without acute cor pulmonale: Secondary | ICD-10-CM

## 2013-06-20 LAB — PULMONARY FUNCTION TEST

## 2013-06-20 NOTE — Progress Notes (Signed)
Subjective:    Patient ID: Betty Perkins, female    DOB: 1976/05/26   MRN: [id number]  HPI  37 yowf quit smoking 2008 fine at that point but  referred to pulmonary clinic 05/06/2013 by Betty Perkins for eval of sob s/p PE March 2014  05/06/2013 1st [facility] office visit/ Betty Perkins cc acute L post cp late Feb 2014 then sob >      Admit date: 11/23/2012  Discharge date: 11/26/2012     Recommendations for Outpatient Follow-up:  1. Patient is to followup at PCPs office on Monday, 11/29/2012 for PT/INR check and further management of her Coumadin. Patient is being discharged on full dose Lovenox as well as Coumadin. 2. Patient is to followup with PCP one week post discharge.  Discharge Diagnoses:  Principal Problem:  Acute pulmonary embolism  Active Problems:  Hyponatremia  Left leg DVT  Headache  Discharge Condition: Stable and improved  Diet recommendation: Regular  Filed Weights    11/23/12 1628  11/23/12 1936   Weight:  116.983 kg (257 lb 14.4 oz)  116.756 kg (257 lb 6.4 oz)   History of present illness:  Betty Perkins is a 37 y.o. female obese female on birth control pills, and otherwise no significant past medical history who presents with above complaints. She states that her symptoms began in February 5 when she developed soreness in her back and later that day the pain became more pleuritic. She states she has had a non-productive cough as well. She went to the walk-in clinic and was diagnosed with musculoskeletal skeletal strain, and she reports about 5 days later she was having more dyspnea on exertion so she followed back up and was in whole to see her PCP in 3 weeks if she was not improving. She continued to have shortness of breath that was worsening and reports having some mild left greater than right leg swelling that appeared to have resolved. She went to see her PCP today and a CT angiogram was ordered and showed large burden of pulmonary embolus with possible  bowing of interventricular septum-nonspecific but could be seen in setting of early right-sided heart failure. She was started on heparin and is admitted for further evaluation and management. She reports that her mother and younger brother have had blood clots. She denies any long distance travel.  Hospital Course:  1 acute PE/ left lower extremity DVT  Patient was admitted with worsening shortness of breath and some mild left greater than right lower extremity swelling. Patient saw her PCP on the day of admission CT angiogram which was ordered showed a large burden of pulmonary embolus with possible bowing of the interventricular septum. Patient was admitted to the telemetry floor and placed on IV heparin. Patient is noted to have a family history of thromboembolic disease. Patient also noted to be on oral contraceptives as well. Patient also noted to be obese. Patient's oral contraceptives were discontinued during the hospitalization. Hypercoagulable panel was also obtained, and negative for lupus anticoagulant. Patient was monitored she was placed in the pain control and supportive care. Lower extremity Dopplers also obtained which showed a left lower extremity DVT. 2-D echo which was done was negative for right ventricular strain. Patient was started on Coumadin. Patient was also subsequently transitioned off IV heparin and placed on full dose Lovenox. Patient was monitored on full dose Lovenox and Coumadin and improved clinically. Patient was ambulating the hallways with oxygen saturations greater than 95% on room air. Patient will  be discharged home on full dose Lovenox as well as Coumadin until INR is therapeutic between 2-3. Patient will followup at PCPs office on Monday, 11/29/2012 for PT/INR check/Coumadin check. Patient will also followup with PCP one week post discharge. Patient will be discharged in stable and improved condition.  #2 hyponatremia  On admission patient was noted to be slightly  hyponatremic with a sodium of 134. It was felt was likely secondary to volume depletion. Patient was hydrated with IV fluids with resolution of her hyponatremia.  #3 left lower lobe nodule  CT scan which was done did show a left lower lobe nodule. Patient will need followup CT scan in 3 months   After discharge saw heme/onc Ha > rec lifelong coumadin based on clot burden and fm hx of dvt, though her w/u for hypercoagulability was negative   05/06/2013 1st [facility] office visit/ Betty Perkins cc doe ever since Feb 2014 that never really improved eg one flight of steps and stops at top and walking x one quarter miled then 8/14 sob taking a shower assoc with new L flank not worse with deep breath, def worse lying L side down> reCT neg for PE.   rec Wt control via control    06/20/2013 f/u ov/Menna Abeln re:  dyspnea Chief Complaint  Patient presents with  . Shortness of Breath    Breathing has improved. Reports SOB from time to time. PFT was done today.  doe x steps 6 flights stopping at each one    No obvious day to day or daytime variabilty or assoc chronic cough or cp or chest tightness, subjective wheeze overt sinus or hb symptoms. No unusual exp hx or h/o childhood pna/ asthma or knowledge of premature birth.  Sleeping ok without nocturnal  or early am exacerbation  of respiratory  c/o's or need for noct saba. Also denies any obvious fluctuation of symptoms with weather or environmental changes or other aggravating or alleviating factors except as outlined above   Current Medications, Allergies, Complete Past Medical History, Past Surgical History, Family History, and Social History were reviewed in Betty Perkins record.  ROS  The following are not active complaints unless bolded sore throat, dysphagia, dental problems, itching, sneezing,  nasal congestion or excess/ purulent secretions, ear ache,   fever, chills, sweats, unintended wt loss, pleuritic or exertional cp,  hemoptysis,  orthopnea pnd or leg swelling, presyncope, palpitations, heartburn, abdominal pain, anorexia, nausea, vomiting, diarrhea  or change in bowel or urinary habits, change in stools or urine, dysuria,hematuria,  rash, arthralgias, visual complaints, headache, numbness weakness or ataxia or problems with walking or coordination,  change in mood/affect or memory.              Objective:   Physical Exam   06/20/2013       292  Wt Readings from Last 3 Encounters:  05/06/13 285 lb (129.275 kg)  04/29/13 280 lb (127.007 kg)  12/27/12 267 lb 12.8 oz (121.473 kg)    amb pleasant obese wf nad   HEENT: nl dentition, turbinates, and orophanx. Nl external ear canals without cough reflex   NECK :  without JVD/Nodes/TM/ nl carotid upstrokes bilaterally   LUNGS: no acc muscle use, clear to A and P bilaterally without cough on insp or exp maneuvers   CV:  RRR  no s3 or murmur   No increase P2, no peripheral edema   ABD:  soft and nontender with nl excursion in the supine position. No bruits or  organomegaly, bowel sounds nl  MS:  warm without deformities, calf tenderness, cyanosis or clubbing  SKIN: warm and dry without lesions    NEURO:  alert, approp, no deficits    CT 04/29/13 Nodular lesion posterior left base remains, although it may be  marginally smaller compared to prior study. Followup study in  approximately 6 months to assess for stability may remains  warranted.  There is no frank edema or consolidation. No demonstrable  pulmonary embolus            Assessment & Plan:

## 2013-06-20 NOTE — Patient Instructions (Addendum)
Weight control is simply a matter of calorie balance which needs to be tilted in your favor by eating less and exercising more.  To get the most out of exercise, you need to be continuously aware that you are short of breath, but never out of breath, for 30 minutes daily. As you improve, it will actually be easier for you to do the same amount of exercise  in  30 minutes so always push to the level where you are short of breath.  If this does not result in gradual weight reduction then I strongly recommend you see a nutritionist with a food diary x 2 weeks so that we can work out a negative calorie balance which is universally effective in steady weight loss programs.  Think of your calorie balance like you do your bank account where in this case you want the balance to go down so you must take in less calories than you burn up.  It's just that simple:  Hard to do, but easy to understand.  Good luck!    

## 2013-06-20 NOTE — Progress Notes (Signed)
PFT done today. 

## 2013-06-22 NOTE — Assessment & Plan Note (Addendum)
Onset likely Feb 2014 while on BCP's with Prob L DVT by venous dopplers 11/24/12  - Heme/onc w/u for hypercoagulability neg (see Ha note 12/27/12) - Repeat CTchest neg 04/29/13  - Echo 05/23/2013 tds > no evidence RV dsyfunction   Rx per Dr Gaylyn Rong, no further pulmonary f/u needed but would consider repeat venous dopplers on L if/ when decision is made to stop anticoagulation which I would not rec unless at least 50 lbs of wt loss occurs and only if v doppler on L is repeated and completely nl

## 2013-06-22 NOTE — Assessment & Plan Note (Signed)
-   05/06/2013  Walked RA x 3 laps @ 185 ft each stopped due to  85% p 2 laps - 06/20/2013  Walked RA x 3 laps @ 185 ft each stopped due to  End of study, no desat or sob  - PFT's 06/20/2013 wnl   No further w/u needed, main issue is obesity/ deconditioning

## 2013-07-05 ENCOUNTER — Other Ambulatory Visit: Payer: Self-pay | Admitting: Internal Medicine

## 2013-07-05 DIAGNOSIS — R0600 Dyspnea, unspecified: Secondary | ICD-10-CM

## 2013-07-15 ENCOUNTER — Encounter: Payer: Self-pay | Admitting: Internal Medicine

## 2013-07-21 ENCOUNTER — Other Ambulatory Visit: Payer: Self-pay

## 2013-08-22 ENCOUNTER — Ambulatory Visit (HOSPITAL_COMMUNITY)
Admission: RE | Admit: 2013-08-22 | Discharge: 2013-08-22 | Disposition: A | Discharge status: Home / Self Care | Payer: BC Managed Care – PPO | Source: Ambulatory Visit | Attending: Oncology | Admitting: Oncology

## 2013-08-22 DIAGNOSIS — R911 Solitary pulmonary nodule: Secondary | ICD-10-CM | POA: Insufficient documentation

## 2013-08-22 MED ORDER — IOHEXOL 300 MG/ML  SOLN
80.0000 mL | Freq: Once | INTRAMUSCULAR | Status: AC | PRN
  Administered 2013-08-22: 80 mL via INTRAVENOUS

## 2014-08-04 ENCOUNTER — Telehealth: Payer: Self-pay | Admitting: Hematology and Oncology

## 2014-08-09 ENCOUNTER — Telehealth: Payer: Self-pay | Admitting: Hematology & Oncology

## 2014-08-09 NOTE — Telephone Encounter (Signed)
Ingrid from referring called said pt is inquiting about appointment. I called back left message that Dr. Myna HidalgoEnnever was wanting to know reason of referral.

## 2014-08-16 ENCOUNTER — Other Ambulatory Visit: Payer: Self-pay | Admitting: Family Medicine

## 2014-08-16 DIAGNOSIS — R911 Solitary pulmonary nodule: Secondary | ICD-10-CM

## 2014-08-28 ENCOUNTER — Ambulatory Visit
Admission: RE | Admit: 2014-08-28 | Discharge: 2014-08-28 | Disposition: A | Discharge status: Home / Self Care | Payer: BC Managed Care – PPO | Source: Ambulatory Visit | Attending: Family Medicine | Admitting: Family Medicine

## 2014-08-28 DIAGNOSIS — R911 Solitary pulmonary nodule: Secondary | ICD-10-CM

## 2014-09-08 IMAGING — US US ABDOMEN LIMITED
1 series · 14 of 25 positions shown · non-contrast
Comparison: CT scans dated 12/10/2012 and 11/24/2006

CLINICAL DATA: Right upper quadrant pain.  Prominent common bile
duct demonstrated on CT scan dated 12/10/2012

LIMITED ABDOMINAL ULTRASOUND - RIGHT UPPER QUADRANT

[Series 1: us abdomen limited · 0.33mm/px · 14 of 54 slices shown]
[im 1/54]
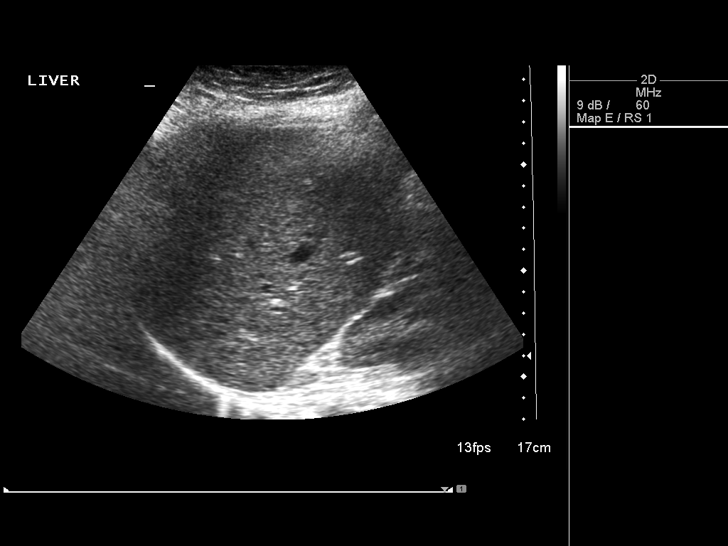
[im 5/54]
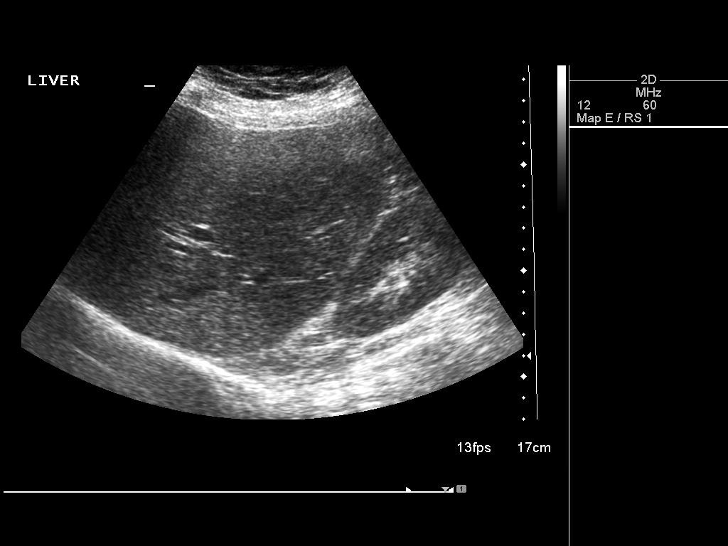
[im 9/54]
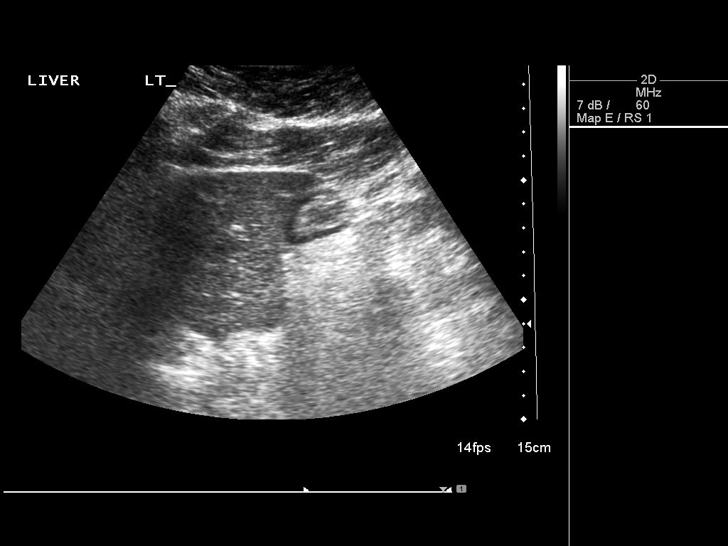
[im 14/54]
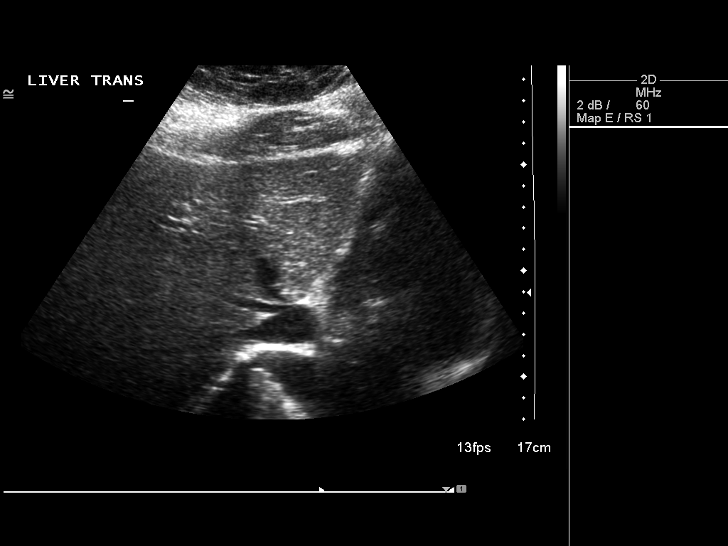
[im 18/54]
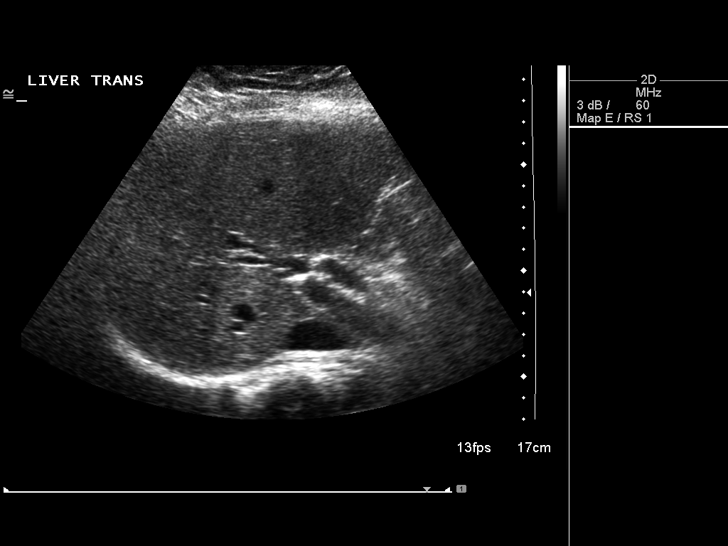
[im 20/54]
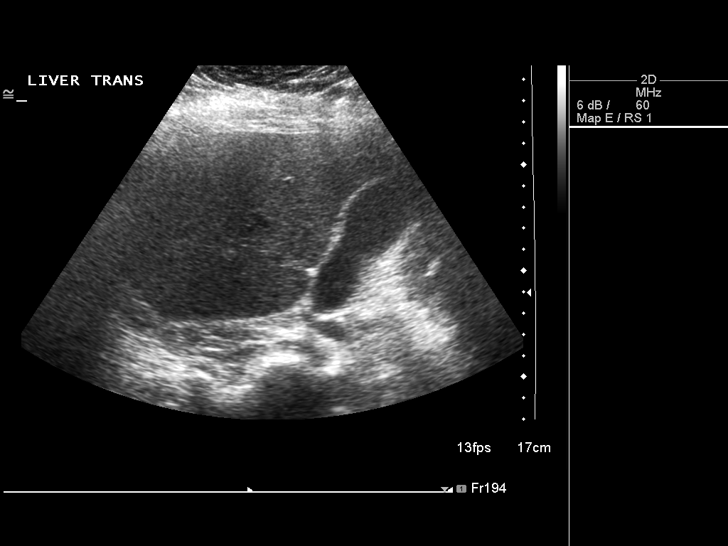
[im 25/54]
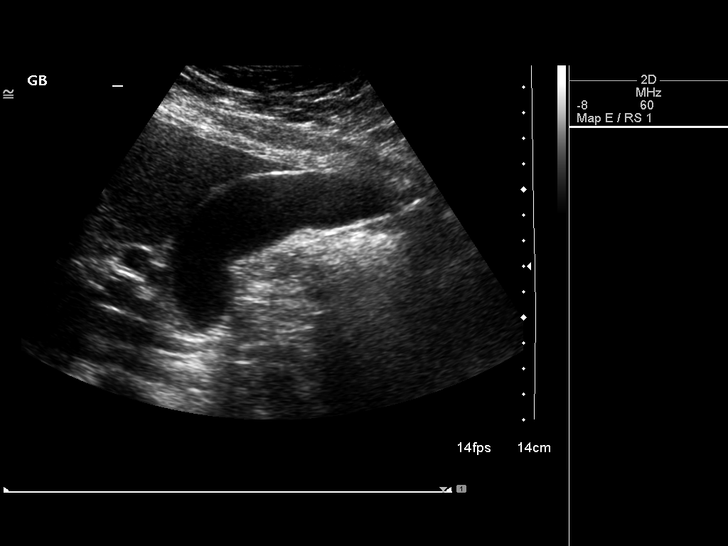
[im 29/54]
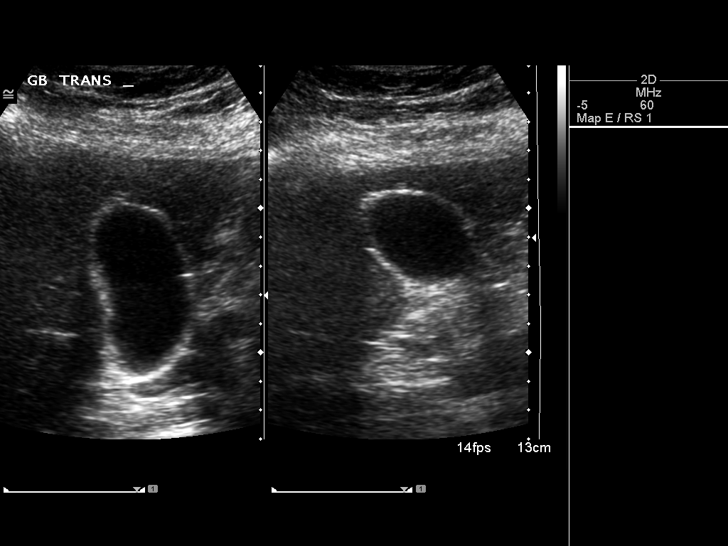
[im 34/54]
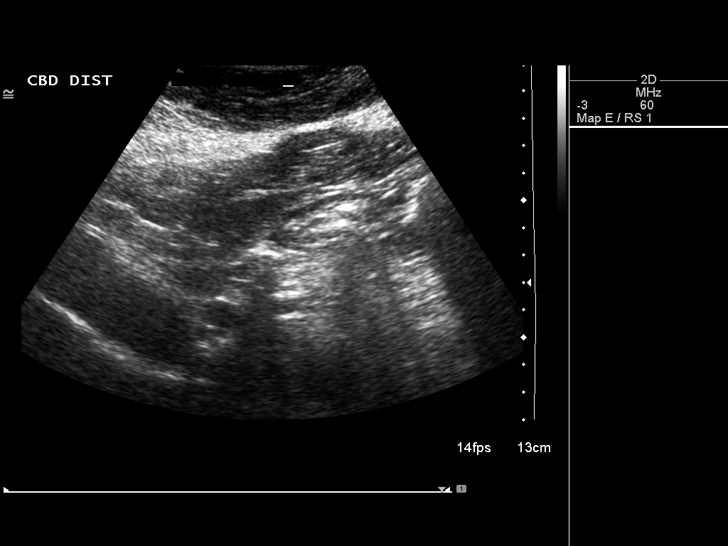
[im 36/54]
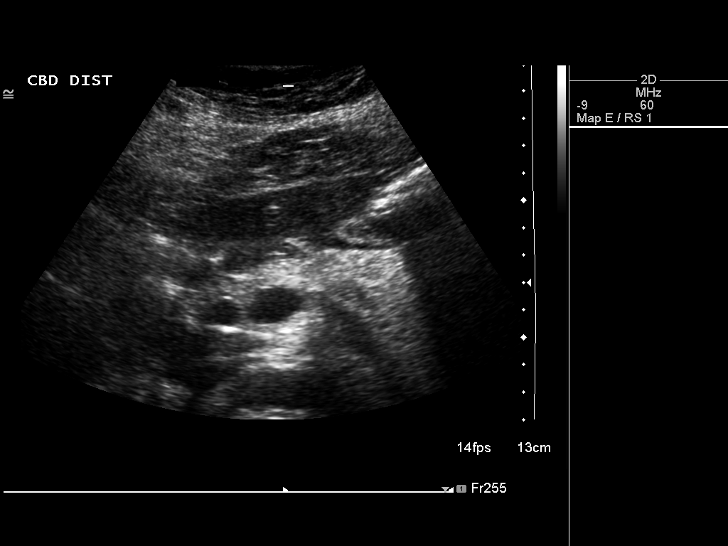
[im 40/54]
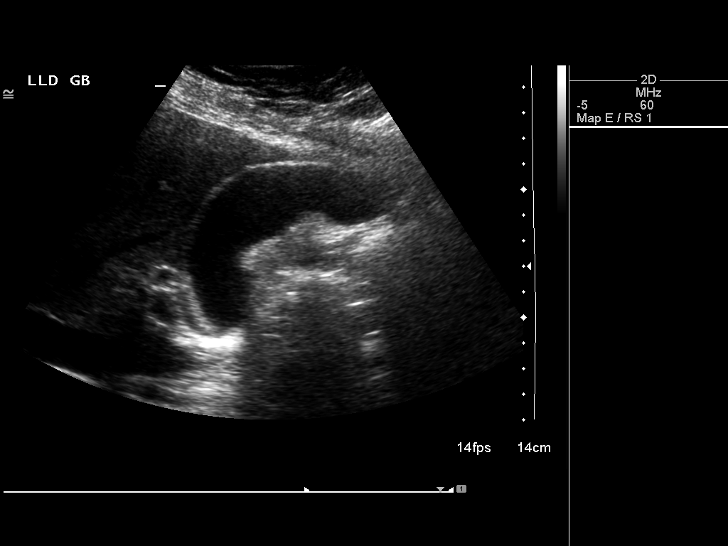
[im 45/54]
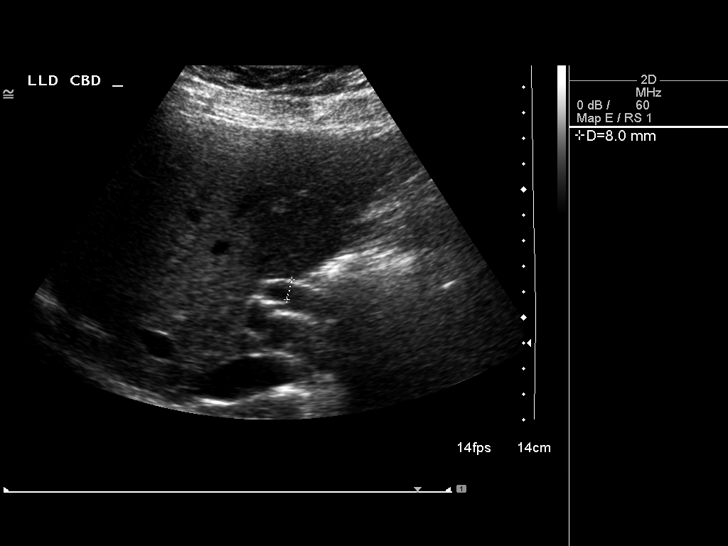
[im 49/54]
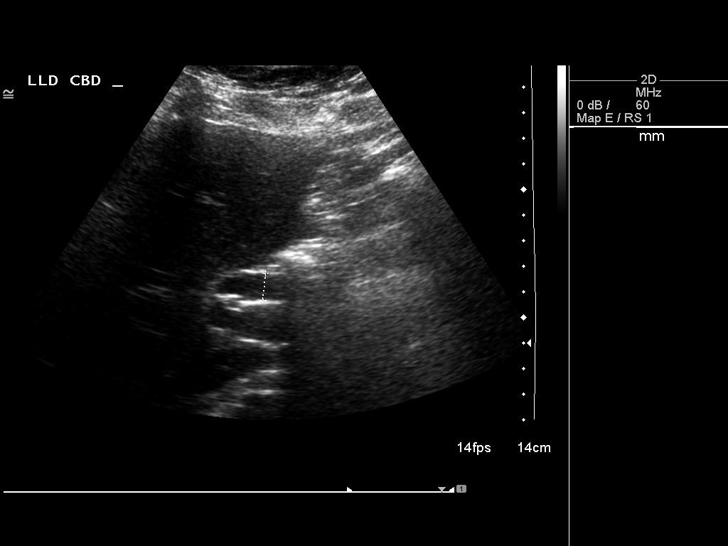
[im 54/54]
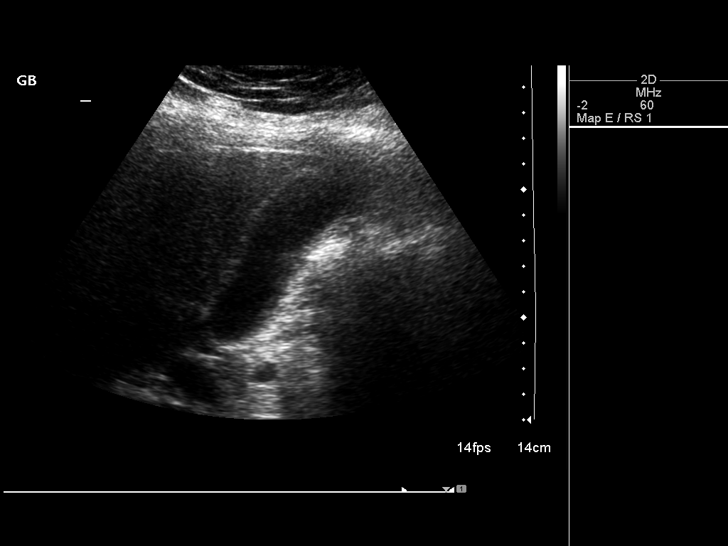

[14 of 25 positions shown; findings below may reference images not displayed]

FINDINGS: Gallbladder:  Normal.  Negative sonographic Murphy's sign.

Common bile duct:  Common bile duct measures at 10.6 mm maximum
diameter.  Slight intrahepatic ductal dilatation.  No visible stone
or mass in the common bile duct.  No visible mass in the pancreatic
head.

Liver:  Slight dilatation of the bile ducts in the region of the
porta hepatis.  Otherwise, normal liver.
IMPRESSION: Dilated biliary tree.  No gallstones.  No visible mass or stone in
the common bile duct. The patient's bilirubin is not elevated.

## 2014-09-11 ENCOUNTER — Ambulatory Visit (HOSPITAL_BASED_OUTPATIENT_CLINIC_OR_DEPARTMENT_OTHER): Payer: BC Managed Care – PPO | Admitting: Hematology & Oncology

## 2014-09-11 ENCOUNTER — Other Ambulatory Visit (HOSPITAL_BASED_OUTPATIENT_CLINIC_OR_DEPARTMENT_OTHER): Payer: BC Managed Care – PPO | Admitting: Lab

## 2014-09-11 ENCOUNTER — Encounter: Payer: Self-pay | Admitting: Hematology & Oncology

## 2014-09-11 VITALS — BP 132/85 | HR 83 | Temp 98.2°F | Resp 14 | Ht 66.0 in | Wt 315.0 lb

## 2014-09-11 DIAGNOSIS — I82402 Acute embolism and thrombosis of unspecified deep veins of left lower extremity: Secondary | ICD-10-CM

## 2014-09-11 DIAGNOSIS — I2699 Other pulmonary embolism without acute cor pulmonale: Secondary | ICD-10-CM

## 2014-09-11 DIAGNOSIS — Z7901 Long term (current) use of anticoagulants: Secondary | ICD-10-CM

## 2014-09-11 LAB — CBC WITH DIFFERENTIAL (CANCER CENTER ONLY)
BASO#: 0 10*3/uL (ref 0.0–0.2)
BASO%: 0.6 % (ref 0.0–2.0)
EOS ABS: 0.1 10*3/uL (ref 0.0–0.5)
EOS%: 1.9 % (ref 0.0–7.0)
HCT: 40.7 % (ref 34.8–46.6)
HGB: 13.5 g/dL (ref 11.6–15.9)
LYMPH#: 1.5 10*3/uL (ref 0.9–3.3)
LYMPH%: 24.7 % (ref 14.0–48.0)
MCH: 29.7 pg (ref 26.0–34.0)
MCHC: 33.2 g/dL (ref 32.0–36.0)
MCV: 90 fL (ref 81–101)
MONO#: 0.4 10*3/uL (ref 0.1–0.9)
MONO%: 6.3 % (ref 0.0–13.0)
NEUT#: 4.2 10*3/uL (ref 1.5–6.5)
NEUT%: 66.5 % (ref 39.6–80.0)
PLATELETS: 317 10*3/uL (ref 145–400)
RBC: 4.55 10*6/uL (ref 3.70–5.32)
RDW: 13.9 % (ref 11.1–15.7)
WBC: 6.2 10*3/uL (ref 3.9–10.0)

## 2014-09-11 LAB — PROTIME-INR (CHCC SATELLITE)
INR: 2.8 (ref 2.0–3.5)
PROTIME: 33.6 s — AB (ref 10.6–13.4)

## 2014-09-11 NOTE — Progress Notes (Signed)
Patient History and Physical   Betty Perkins [id number] 08-Aug-1976 38 y.o. 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  HPI:  [name] is a very charming [age] her old white female. She has no past history before she had idiopathic pulmonary embolism. This was back and I think in March 2014. She had a fairly large clot burden in her lung. She had a saddle embolism. She had some right heart strain. She did not require any thrombolytic therapy.  She was found to have a thrombus in the left leg. This, I think, was in the popliteal vein. She was started on IV heparin. She was then placed onto Coumadin.  She is a very interesting family history in which she says both parents have a clotting factor issue. Her brother has had blood clots also. She says that he inherited the clotting factor deficiency.  She underwent hypercoagulable study. Everything came back normal. She had an elevated IgM and beta 2 glycoprotein. Her factor V Leiden and prothrombin 2 gene mutations were normal.  She had a follow-up CT scan in August 2014. The blood clots were gone.  She was told that she needed to be on blood thinner the rest for life.  She had been on birth control pills. She was smoking. She is obese. She has lost about 100 pounds. She's gained a little weight however.  She wants to get pregnant.  Her [facility] to decide about duration of anticoagulation.  She's working. 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  She's had no cough. There has been no shortness of breath. She's had no problems with her monthly cycles. There's been no change in bowel or bladder habits.  She says that her 2 grandfathers had cancer when they were older.  Currently, her performance status is ECOG 0.         PMH: Past Medical History  Diagnosis Date  .  Pulmonary embolism   . Superficial thrombophlebitis 12/2011  . DVT (deep venous thrombosis)     Past Surgical History  Procedure Laterality Date  . Ankle surgery      right ankle  . Foot surgery      left foot    Allergies: Allergies  Allergen Reactions  . Augmentin [Amoxicillin-Pot Clavulanate] Nausea And Vomiting    Medications:  (Not in a hospital admission)  Social History:   reports that she quit smoking about 7 years ago. Her smoking use included Cigarettes. She started smoking about 18 years ago. She has a 10 pack-year smoking history. She has never used smokeless tobacco. She reports that she does not drink alcohol or use illicit drugs.  Family History: Family History  Problem Relation Age of Onset  . Deep vein thrombosis Other   . Deep vein thrombosis Mother   . Hypertension Mother   . Deep vein thrombosis Father   . Hypertension Father   . Deep vein thrombosis Brother   . Deep vein thrombosis Maternal Aunt   . Stroke Maternal Grandmother   . Diabetes Maternal Grandmother   . Hypertension Maternal Grandmother   . Cancer Maternal Grandfather     lung  . Cancer Paternal Grandfather     head/neck    Review of Systems: Constitutional ROS: Fever none, Chills, Night Sweats, Anorexia, Pain none Cardiovascular ROS: negative Respiratory ROS:  negative Neurological ROS: negative Dermatological ROS: negative ENT ROS: negative Gastrointestinal ROS: no abdominal pain, change in bowel habits, or black or bloody stools Genito-Urinary ROS: no dysuria, trouble voiding, or hematuria Hematological and Lymphatic ROS: negative Breast ROS: negative for breast lumps Musculoskeletal ROS: negative Remaining ROS negative.  Physical Exam: Blood pressure 132/85, pulse 83, temperature 98.2 F (36.8 C), temperature source Oral, resp. rate 14, height 5\' 6"  (1.676 m), weight 315 lb (142.883 kg), last menstrual period 08/07/2014. General appearance: alert Head: Normocephalic,  without obvious abnormality, atraumatic Neck: no adenopathy, no carotid bruit, no JVD, supple, symmetrical, trachea midline and thyroid not enlarged, symmetric, no tenderness/mass/nodules Lymph nodes: Cervical, supraclavicular, and axillary nodes normal.  Lab Results: Lab Results  Component Value Date   WBC 6.2 09/11/2014   HGB 13.5 09/11/2014   HCT 40.7 09/11/2014   MCV 90 09/11/2014   PLT 317 09/11/2014     Chemistry      Component Value Date/Time   NA 136 04/29/2013 1655   K 3.8 04/29/2013 1655   CL 104 04/29/2013 1655   CO2 19 04/29/2013 1655   BUN 16 04/29/2013 1655   CREATININE 0.79 04/29/2013 1655      Component Value Date/Time   CALCIUM 9.6 04/29/2013 1655   ALKPHOS 67 12/10/2012 0041   AST 19 12/10/2012 0041   ALT 31 12/10/2012 0041   BILITOT 0.2* 12/10/2012 0041       Radiological Studies: None  Impression and Plan: [name] is a 89 her old white female. She had one episode of a pulmonary embolism. She is on anticoagulation.  I really do not think that she needs lifelong anticoagulation. She, again, has had only one episode of a thromboembolic event.  I think that 2 years of anticoagulation would be adequate. It is reassuring that the follow-up CT scan did not show any residual blood clot in the lung.  I think that at the end of April 2013, she can stop the Coumadin and go on to 2 baby aspirin a day. I think this would be warranted.  I think that if she does become pregnant, she will need therapeutic anticoagulation with Lovenox. She understands this.  We spent about 40 minutes. I talked her about her thromboembolic event. I told her that I just don't believe that she would need lifelong anticoagulation. If, she does have a another thromboembolic event, she will need lifelong anticoagulation. I would then put her on one of the new oral anticoagulants for ease of administration.  I told her that if she becomes pregnant, to let us know immediately so that  we can get her onto Lovenox. She'll need therapeutic Lovenox throughout the pregnancy and for about 8 weeks after she delivers.  I certainly expect to see her again when she lets us know that she is with child.     Josph MachoENNEVER,Emberlee Sortino R, MD 09/11/2014, 11:54 AM

## 2014-09-12 ENCOUNTER — Encounter: Payer: Self-pay | Admitting: Hematology & Oncology

## 2014-09-18 ENCOUNTER — Other Ambulatory Visit: Payer: Self-pay | Admitting: *Deleted

## 2014-11-19 ENCOUNTER — Encounter (HOSPITAL_BASED_OUTPATIENT_CLINIC_OR_DEPARTMENT_OTHER): Payer: Self-pay | Admitting: *Deleted

## 2014-11-19 ENCOUNTER — Emergency Department (HOSPITAL_BASED_OUTPATIENT_CLINIC_OR_DEPARTMENT_OTHER): Payer: BLUE CROSS/BLUE SHIELD

## 2014-11-19 ENCOUNTER — Emergency Department (HOSPITAL_BASED_OUTPATIENT_CLINIC_OR_DEPARTMENT_OTHER)
Admission: EM | Admit: 2014-11-19 | Discharge: 2014-11-19 | Disposition: A | Discharge status: Home / Self Care | Payer: BLUE CROSS/BLUE SHIELD | Attending: Emergency Medicine | Admitting: Emergency Medicine

## 2014-11-19 DIAGNOSIS — Z87891 Personal history of nicotine dependence: Secondary | ICD-10-CM | POA: Insufficient documentation

## 2014-11-19 DIAGNOSIS — Z86711 Personal history of pulmonary embolism: Secondary | ICD-10-CM | POA: Diagnosis not present

## 2014-11-19 DIAGNOSIS — Z86718 Personal history of other venous thrombosis and embolism: Secondary | ICD-10-CM | POA: Diagnosis not present

## 2014-11-19 DIAGNOSIS — Z7901 Long term (current) use of anticoagulants: Secondary | ICD-10-CM | POA: Diagnosis not present

## 2014-11-19 DIAGNOSIS — J069 Acute upper respiratory infection, unspecified: Secondary | ICD-10-CM | POA: Diagnosis not present

## 2014-11-19 DIAGNOSIS — R0602 Shortness of breath: Secondary | ICD-10-CM | POA: Diagnosis present

## 2014-11-19 DIAGNOSIS — Z8679 Personal history of other diseases of the circulatory system: Secondary | ICD-10-CM | POA: Diagnosis not present

## 2014-11-19 MED ORDER — IPRATROPIUM-ALBUTEROL 0.5-2.5 (3) MG/3ML IN SOLN
3.0000 mL | Freq: Once | RESPIRATORY_TRACT | Status: AC
  Administered 2014-11-19: 3 mL via RESPIRATORY_TRACT
  Filled 2014-11-19: qty 3

## 2014-11-19 MED ORDER — ALBUTEROL SULFATE HFA 108 (90 BASE) MCG/ACT IN AERS
2.0000 | INHALATION_SPRAY | RESPIRATORY_TRACT | Status: DC | PRN
  Administered 2014-11-19: 2 via RESPIRATORY_TRACT
  Filled 2014-11-19: qty 6.7

## 2014-11-19 MED ORDER — GUAIFENESIN-CODEINE 100-10 MG/5ML PO SOLN: 10.0000 mL | Freq: Four times a day (QID) | ORAL | Status: DC | PRN

## 2014-11-19 NOTE — Discharge Instructions (Signed)
Albuterol inhaler: 2 puffs every 4 hours as needed for wheezing.  Robitussin with codeine as needed for cough.  Follow-up with your primary Dr. if not improving in the next week, and return to the ER if your symptoms substantially worsen or change.   Upper Respiratory Infection, Adult An upper respiratory infection (URI) is also sometimes known as the common cold. The upper respiratory tract includes the nose, sinuses, throat, trachea, and bronchi. Bronchi are the airways leading to the lungs. Most people improve within 1 week, but symptoms can last up to 2 weeks. A residual cough may last even longer.  CAUSES Many different viruses can infect the tissues lining the upper respiratory tract. The tissues become irritated and inflamed and often become very moist. Mucus production is also common. A cold is contagious. You can easily spread the virus to others by oral contact. This includes kissing, sharing a glass, coughing, or sneezing. Touching your mouth or nose and then touching a surface, which is then touched by another person, can also spread the virus. SYMPTOMS  Symptoms typically develop 1 to 3 days after you come in contact with a cold virus. Symptoms vary from person to person. They may include:  Runny nose.  Sneezing.  Nasal congestion.  Sinus irritation.  Sore throat.  Loss of voice (laryngitis).  Cough.  Fatigue.  Muscle aches.  Loss of appetite.  Headache.  Low-grade fever. DIAGNOSIS  You might diagnose your own cold based on familiar symptoms, since most people get a cold 2 to 3 times a year. Your caregiver can confirm this based on your exam. Most importantly, your caregiver can check that your symptoms are not due to another disease such as strep throat, sinusitis, pneumonia, asthma, or epiglottitis. Blood tests, throat tests, and X-rays are not necessary to diagnose a common cold, but they may sometimes be helpful in excluding other more serious diseases. Your  caregiver will decide if any further tests are required. RISKS AND COMPLICATIONS  You may be at risk for a more severe case of the common cold if you smoke cigarettes, have chronic heart disease (such as heart failure) or lung disease (such as asthma), or if you have a weakened immune system. The very young and very old are also at risk for more serious infections. Bacterial sinusitis, middle ear infections, and bacterial pneumonia can complicate the common cold. The common cold can worsen asthma and chronic obstructive pulmonary disease (COPD). Sometimes, these complications can require emergency medical care and may be life-threatening. PREVENTION  The best way to protect against getting a cold is to practice good hygiene. Avoid oral or hand contact with people with cold symptoms. Wash your hands often if contact occurs. There is no clear evidence that vitamin C, vitamin E, echinacea, or exercise reduces the chance of developing a cold. However, it is always recommended to get plenty of rest and practice good nutrition. TREATMENT  Treatment is directed at relieving symptoms. There is no cure. Antibiotics are not effective, because the infection is caused by a virus, not by bacteria. Treatment may include:  Increased fluid intake. Sports drinks offer valuable electrolytes, sugars, and fluids.  Breathing heated mist or steam (vaporizer or shower).  Eating chicken soup or other clear broths, and maintaining good nutrition.  Getting plenty of rest.  Using gargles or lozenges for comfort.  Controlling fevers with ibuprofen or acetaminophen as directed by your caregiver.  Increasing usage of your inhaler if you have asthma. Zinc gel and zinc lozenges,  taken in the first 24 hours of the common cold, can shorten the duration and lessen the severity of symptoms. Pain medicines may help with fever, muscle aches, and throat pain. A variety of non-prescription medicines are available to treat congestion  and runny nose. Your caregiver can make recommendations and may suggest nasal or lung inhalers for other symptoms.  HOME CARE INSTRUCTIONS   Only take over-the-counter or prescription medicines for pain, discomfort, or fever as directed by your caregiver.  Use a warm mist humidifier or inhale steam from a shower to increase air moisture. This may keep secretions moist and make it easier to breathe.  Drink enough water and fluids to keep your urine clear or pale yellow.  Rest as needed.  Return to work when your temperature has returned to normal or as your caregiver advises. You may need to stay home longer to avoid infecting others. You can also use a face mask and careful hand washing to prevent spread of the virus. SEEK MEDICAL CARE IF:   After the first few days, you feel you are getting worse rather than better.  You need your caregiver's advice about medicines to control symptoms.  You develop chills, worsening shortness of breath, or brown or red sputum. These may be signs of pneumonia.  You develop yellow or brown nasal discharge or pain in the face, especially when you bend forward. These may be signs of sinusitis.  You develop a fever, swollen neck glands, pain with swallowing, or white areas in the back of your throat. These may be signs of strep throat. SEEK IMMEDIATE MEDICAL CARE IF:   You have a fever.  You develop severe or persistent headache, ear pain, sinus pain, or chest pain.  You develop wheezing, a prolonged cough, cough up blood, or have a change in your usual mucus (if you have chronic lung disease).  You develop sore muscles or a stiff neck. Document Released: 02/25/2001 Document Revised: 11/24/2011 Document Reviewed: 12/07/2013 [facility] Patient Information 2015 [place], [place]. This information is not intended to replace advice given to you by your health care provider. Make sure you discuss any questions you have with your health care provider.

## 2014-11-19 NOTE — ED Notes (Signed)
Patient c/o productive cough/congestion for the past three days, no n/v/d, had chills one of the nights with coughing episode

## 2014-11-19 NOTE — ED Provider Notes (Signed)
CSN: [id number]     Arrival date & time 11/19/14  0825 History   First MD Initiated Contact with Patient 11/19/14 [phone]     Chief Complaint  Patient presents with  . Shortness of Breath     (Consider location/radiation/quality/duration/timing/severity/associated sxs/prior Treatment) HPI Comments: Patient is a 39 year old female who presents with complaints of chest congestion and cough for the past 3 days. Her cough has been intermittently productive. She denies any chest pain.  Patient is a 39 y.o. female presenting with shortness of breath. The history is provided by the patient.  Shortness of Breath Severity:  Moderate Onset quality:  Gradual Duration:  3 days Timing:  Constant Progression:  Worsening Chronicity:  New Context: URI   Context: not activity   Relieved by:  Nothing Worsened by:  Nothing tried   Past Medical History  Diagnosis Date  . Pulmonary embolism   . Superficial thrombophlebitis 12/2011  . DVT (deep venous thrombosis)    Past Surgical History  Procedure Laterality Date  . Ankle surgery      right ankle  . Foot surgery      left foot   Family History  Problem Relation Age of Onset  . Deep vein thrombosis Other   . Deep vein thrombosis Mother   . Hypertension Mother   . Deep vein thrombosis Father   . Hypertension Father   . Deep vein thrombosis Brother   . Deep vein thrombosis Maternal Aunt   . Stroke Maternal Grandmother   . Diabetes Maternal Grandmother   . Hypertension Maternal Grandmother   . Cancer Maternal Grandfather     lung  . Cancer Paternal Grandfather     head/neck   History  Substance Use Topics  . Smoking status: Former Smoker -- 1.00 packs/day for 10 years    Types: Cigarettes    Start date: 09/11/1996    Quit date: 11/24/2006  . Smokeless tobacco: Never Used     Comment: quit smoking 7 years ago  . Alcohol Use: No   OB History    No data available     Review of Systems  Respiratory: Positive for shortness of  breath.   All other systems reviewed and are negative.     Allergies  Augmentin  Home Medications   Prior to Admission medications   Medication Sig Start Date End Date Taking? Authorizing Provider  acetaminophen (TYLENOL) 500 MG tablet Take 1,000 mg by mouth every 6 (six) hours as needed for pain.    Historical Provider, MD  warfarin (COUMADIN) 10 MG tablet Take 10 mg by mouth daily. All other days    Historical Provider, MD   BP 137/94 mmHg  Pulse 88  Temp(Src) 98.1 F (36.7 C) (Oral)  Resp 20  Ht 5\' 6"  (1.676 m)  Wt 306 lb (138.801 kg)  BMI 49.41 kg/m2  SpO2 100% Physical Exam  Constitutional: She is oriented to person, place, and time. She appears well-developed and well-nourished. No distress.  HENT:  Head: Normocephalic and atraumatic.  Neck: Normal range of motion. Neck supple.  Cardiovascular: Normal rate and regular rhythm.  Exam reveals no gallop and no friction rub.   No murmur heard. Pulmonary/Chest: Effort normal. No respiratory distress. She has wheezes.  There are slight expiratory wheezes bilaterally.  Abdominal: Soft. Bowel sounds are normal. She exhibits no distension. There is no tenderness.  Musculoskeletal: Normal range of motion.  Neurological: She is alert and oriented to person, place, and time.  Skin: Skin is warm  and dry. She is not diaphoretic.  Nursing note and vitals reviewed.   ED Course  Procedures (including critical care time) Labs Review Labs Reviewed - No data to display  Imaging Review No results found.   EKG Interpretation None      MDM   Final diagnoses:  None    Patient presents with chest congestion and cough. Symptoms sound viral in nature. Will provide an inhaler and cough medication. She is to follow-up as needed if not improving in the next week and return to the ER if her symptoms worsen or change.    Geoffery Lyons, MD 11/19/14 [phone]

## 2014-12-12 ENCOUNTER — Other Ambulatory Visit (HOSPITAL_COMMUNITY)
Admission: RE | Admit: 2014-12-12 | Discharge: 2014-12-12 | Disposition: A | Discharge status: Home / Self Care | Payer: BLUE CROSS/BLUE SHIELD | Source: Ambulatory Visit | Attending: Family Medicine | Admitting: Family Medicine

## 2014-12-12 ENCOUNTER — Other Ambulatory Visit: Payer: Self-pay | Admitting: Family Medicine

## 2014-12-12 DIAGNOSIS — Z124 Encounter for screening for malignant neoplasm of cervix: Secondary | ICD-10-CM | POA: Diagnosis not present

## 2014-12-15 LAB — CYTOLOGY - PAP

## 2015-02-08 ENCOUNTER — Other Ambulatory Visit: Payer: Self-pay | Admitting: Family Medicine

## 2015-02-08 DIAGNOSIS — R911 Solitary pulmonary nodule: Secondary | ICD-10-CM

## 2015-03-05 ENCOUNTER — Ambulatory Visit
Admission: RE | Admit: 2015-03-05 | Discharge: 2015-03-05 | Disposition: A | Discharge status: Home / Self Care | Payer: BLUE CROSS/BLUE SHIELD | Source: Ambulatory Visit | Attending: Family Medicine | Admitting: Family Medicine

## 2015-03-05 DIAGNOSIS — R911 Solitary pulmonary nodule: Secondary | ICD-10-CM

## 2015-05-23 ENCOUNTER — Telehealth: Payer: Self-pay | Admitting: *Deleted

## 2015-05-23 NOTE — Telephone Encounter (Signed)
Patient is [redacted] weeks pregnant. She has an appointment to see Dr Myna Hidalgo this month, but she wants to know if she should continue taking a baby aspirin daily until she can see him. Spoke to Dr Myna Hidalgo who wants the patient to take two baby aspirin daily until she is seen. Patient is in understanding of these instructions.

## 2015-05-30 ENCOUNTER — Telehealth: Payer: Self-pay | Admitting: *Deleted

## 2015-05-30 NOTE — Telephone Encounter (Signed)
Patient wants to know if it's okay to go ahead and start lovenox  sq that was prescribed by her OBGYN prior to seeing Dr Myna Hidalgo next week. Dr Myna Hidalgo is fine with this. Patient aware.

## 2015-06-07 ENCOUNTER — Encounter: Payer: Self-pay | Admitting: Hematology & Oncology

## 2015-06-07 ENCOUNTER — Ambulatory Visit (HOSPITAL_BASED_OUTPATIENT_CLINIC_OR_DEPARTMENT_OTHER): Payer: BLUE CROSS/BLUE SHIELD | Admitting: Hematology & Oncology

## 2015-06-07 ENCOUNTER — Other Ambulatory Visit (HOSPITAL_BASED_OUTPATIENT_CLINIC_OR_DEPARTMENT_OTHER): Payer: BLUE CROSS/BLUE SHIELD

## 2015-06-07 VITALS — BP 140/75 | HR 87 | Temp 97.5°F | Resp 16 | Ht 66.0 in | Wt 323.0 lb

## 2015-06-07 DIAGNOSIS — I2699 Other pulmonary embolism without acute cor pulmonale: Secondary | ICD-10-CM

## 2015-06-07 DIAGNOSIS — D689 Coagulation defect, unspecified: Secondary | ICD-10-CM | POA: Diagnosis not present

## 2015-06-07 DIAGNOSIS — O99111 Other diseases of the blood and blood-forming organs and certain disorders involving the immune mechanism complicating pregnancy, first trimester: Secondary | ICD-10-CM | POA: Diagnosis not present

## 2015-06-07 LAB — CMP (CANCER CENTER ONLY)
ALT(SGPT): 39 U/L (ref 10–47)
AST: 31 U/L (ref 11–38)
Albumin: 3.5 g/dL (ref 3.3–5.5)
Alkaline Phosphatase: 70 U/L (ref 26–84)
BUN: 10 mg/dL (ref 7–22)
CHLORIDE: 106 meq/L (ref 98–108)
CO2: 25 meq/L (ref 18–33)
CREATININE: 0.8 mg/dL (ref 0.6–1.2)
Calcium: 10.3 mg/dL (ref 8.0–10.3)
GLUCOSE: 73 mg/dL (ref 73–118)
Potassium: 4.6 mEq/L (ref 3.3–4.7)
SODIUM: 134 meq/L (ref 128–145)
Total Bilirubin: 0.5 mg/dl (ref 0.20–1.60)
Total Protein: 6.9 g/dL (ref 6.4–8.1)

## 2015-06-07 LAB — CBC WITH DIFFERENTIAL (CANCER CENTER ONLY)
BASO#: 0.1 10*3/uL (ref 0.0–0.2)
BASO%: 0.5 % (ref 0.0–2.0)
EOS ABS: 0.1 10*3/uL (ref 0.0–0.5)
EOS%: 1.2 % (ref 0.0–7.0)
HCT: 39.9 % (ref 34.8–46.6)
HEMOGLOBIN: 13.3 g/dL (ref 11.6–15.9)
LYMPH#: 2.8 10*3/uL (ref 0.9–3.3)
LYMPH%: 26.4 % (ref 14.0–48.0)
MCH: 30.9 pg (ref 26.0–34.0)
MCHC: 33.3 g/dL (ref 32.0–36.0)
MCV: 93 fL (ref 81–101)
MONO#: 0.9 10*3/uL (ref 0.1–0.9)
MONO%: 8.4 % (ref 0.0–13.0)
NEUT%: 63.5 % (ref 39.6–80.0)
NEUTROS ABS: 6.7 10*3/uL — AB (ref 1.5–6.5)
Platelets: 291 10*3/uL (ref 145–400)
RBC: 4.3 10*6/uL (ref 3.70–5.32)
RDW: 14 % (ref 11.1–15.7)
WBC: 10.5 10*3/uL — AB (ref 3.9–10.0)

## 2015-06-07 MED ORDER — ENOXAPARIN SODIUM 150 MG/ML ~~LOC~~ SOLN: 150.0000 mg | SUBCUTANEOUS | Status: DC

## 2015-06-07 NOTE — Progress Notes (Signed)
Hematology and Oncology Follow Up Visit  Betty Perkins [id number] 12-12-1975 39 y.o. 06/07/2015   Principle Diagnosis:   First trimester pregnancy  History of idiopathic pulmonary embolism and left lower extremity DVT  Current Therapy:    Lovenox 40 mg subcutaneous daily     Interim History:  Betty Perkins is back for follow-up. She is now pregnant. She is probably about [redacted] weeks pregnant.  I first saw her back in December 2015. She was on Coumadin. She had a poor embolism and left lower extremity thrombus. She was placed on Coumadin. I thought that 2 years would be adequate. This had all happened back in March 2014. She has stopped anticoagulation in March 2016.  Her hypercoagulable studies that were done showed a mildly elevated IgM beta-2 glycoprotein.  She did have a repeat CT angiogram in August 2014. This did not show any residual pulmonary emboli.  She had a ultrasound of the left leg in March 2014. This did show a age-indeterminate thrombus in the left distal popliteal vein and tibio-peroneal vein.  Once she found out that she was pregnant, her gynecologist put her on Lovenox at 40 mg subcutaneous daily.  She is having a hard time affording this. She will clearly need a much higher dose because of the thromboembolic disease. She will need a dose of 150 mg daily.  She is doing well. She's having no problems with morning sickness. She's having no cough. His no been no leg swelling. She's had no change in bowel or bladder habits.  She's had no rashes.  She's had no pruritus.  Overall, her performance status is ECOG 0.  Medications:  Current outpatient prescriptions:  .  acetaminophen (TYLENOL) 500 MG tablet, Take 1,000 mg by mouth every 6 (six) hours as needed for pain., Disp: , Rfl:  .  enoxaparin (LOVENOX) 150 MG/ML injection, Inject 1 mL (150 mg total) into the skin daily., Disp: 30 Syringe, Rfl: 11  Allergies:  Allergies  Allergen Reactions  . Augmentin  [Amoxicillin-Pot Clavulanate] Nausea And Vomiting    Past Medical History, Surgical history, Social history, and Family History were reviewed and updated.  Review of Systems: As above  Physical Exam:  height is  (1.676 m) and weight is 323 lb (146.512 kg). Her oral temperature is 97.5 F (36.4 C). Her blood pressure is 140/75 and her pulse is 87. Her respiration is 16.   Wt Readings from Last 3 Encounters:  06/07/15 323 lb (146.512 kg)  11/19/14 306 lb (138.801 kg)  09/11/14 315 lb (142.883 kg)     Obese white female in no obvious distress. Head and neck exam shows no ocular or oral lesions. She has no palpable cervical or supraclavicular lymph nodes. Lungs are clear. Cardiac exam regular rate and rhythm with no murmurs, rubs or bruits. Abdomen is soft. She has good bowel sounds. There is no fluid wave. There is no guarding or rebound tenderness. Back exam shows no tenderness over the spine, ribs or hips. Extremities shows no clubbing, cyanosis or edema. She has no venous cord in the legs. She has a negative Homans sign. Skin exam shows no rashes, ecchymoses or petechia. Neurological exam shows no focal neurological deficits.  Lab Results  Component Value Date   WBC 10.5* 06/07/2015   HGB 13.3 06/07/2015   HCT 39.9 06/07/2015   MCV 93 06/07/2015   PLT 291 06/07/2015     Chemistry      Component Value Date/Time   NA 134 06/07/2015  1447   NA 136 04/29/2013 1655   K 4.6 06/07/2015 1447   K 3.8 04/29/2013 1655   CL 106 06/07/2015 1447   CL 104 04/29/2013 1655   CO2 25 06/07/2015 1447   CO2 19 04/29/2013 1655   BUN 10 06/07/2015 1447   BUN 16 04/29/2013 1655   CREATININE 0.8 06/07/2015 1447   CREATININE 0.79 04/29/2013 1655      Component Value Date/Time   CALCIUM 10.3 06/07/2015 1447   CALCIUM 9.6 04/29/2013 1655   ALKPHOS 70 06/07/2015 1447   ALKPHOS 67 12/10/2012 0041   AST 31 06/07/2015 1447   AST 19 12/10/2012 0041   ALT 39 06/07/2015 1447   ALT 31 12/10/2012  0041   BILITOT 0.50 06/07/2015 1447   BILITOT 0.2* 12/10/2012 0041         Impression and Plan: Betty Perkins is 39 year old white female. She is now pregnant. She is on 40 mg a of Lovenox.  Again, she will need therapeutic Lovenox. She had the pulmonary emboli and left lower extremity thrombus 2-1/2 years ago.  I think that she will be at significant risk for recurrence of thromboembolic disease. As such, I truly believe that she will need therapeutic Lovenox.  Given her weight, a therapeutic dose will be 150 mg subcutaneous daily. I think this would be very reasonable.  For right now, I told her to at least double up on the Lovenox that she has at home. We are trying our best to get her assistance so that she can afford the Lovenox.  Hopefully, we will be able to succeed at giving her the Lovenox. If not, then we might have to consider heparin which would be a much bigger inconvenience for her as this probably would have to be given to her 3 times a day.  I spent about 45 mins with her. We talked about why she needs therapeutic Lovenox. She understands this. She certainly is willing to do what she needs to do to stay healthy during the pregnancy.  I will plan to see her back in about 6 weeks' time. Hopefully, we will be able to 60 with having her obtain therapeutic Lovenox.  Josph Macho, MD 9/22/20165:04 PM

## 2015-06-08 ENCOUNTER — Telehealth: Payer: Self-pay | Admitting: Hematology & Oncology

## 2015-06-08 ENCOUNTER — Encounter: Payer: BLUE CROSS/BLUE SHIELD | Admitting: Hematology & Oncology

## 2015-06-08 LAB — OB RESULTS CONSOLE GC/CHLAMYDIA
CHLAMYDIA, DNA PROBE: NEGATIVE
Gonorrhea: NEGATIVE

## 2015-06-08 LAB — OB RESULTS CONSOLE ANTIBODY SCREEN: Antibody Screen: NEGATIVE

## 2015-06-08 LAB — OB RESULTS CONSOLE ABO/RH: RH Type: POSITIVE

## 2015-06-08 LAB — OB RESULTS CONSOLE RPR: RPR: NONREACTIVE

## 2015-06-08 LAB — OB RESULTS CONSOLE RUBELLA ANTIBODY, IGM: RUBELLA: IMMUNE

## 2015-06-08 LAB — OB RESULTS CONSOLE HIV ANTIBODY (ROUTINE TESTING): HIV: NONREACTIVE

## 2015-06-08 LAB — OB RESULTS CONSOLE HEPATITIS B SURFACE ANTIGEN: HEP B S AG: NEGATIVE

## 2015-06-08 NOTE — Telephone Encounter (Signed)
Called patient's home #. L/m stating patient's appt for November 2016.       [facility].

## 2015-06-13 NOTE — Progress Notes (Signed)
This encounter was created in error - please disregard.

## 2015-07-19 ENCOUNTER — Ambulatory Visit (HOSPITAL_BASED_OUTPATIENT_CLINIC_OR_DEPARTMENT_OTHER): Payer: BLUE CROSS/BLUE SHIELD | Admitting: Hematology & Oncology

## 2015-07-19 ENCOUNTER — Other Ambulatory Visit (HOSPITAL_BASED_OUTPATIENT_CLINIC_OR_DEPARTMENT_OTHER): Payer: BLUE CROSS/BLUE SHIELD

## 2015-07-19 ENCOUNTER — Encounter: Payer: Self-pay | Admitting: Hematology & Oncology

## 2015-07-19 VITALS — BP 130/54 | HR 85 | Temp 97.8°F | Resp 16 | Ht 66.0 in | Wt 319.0 lb

## 2015-07-19 DIAGNOSIS — I2699 Other pulmonary embolism without acute cor pulmonale: Secondary | ICD-10-CM

## 2015-07-19 DIAGNOSIS — O99111 Other diseases of the blood and blood-forming organs and certain disorders involving the immune mechanism complicating pregnancy, first trimester: Secondary | ICD-10-CM

## 2015-07-19 DIAGNOSIS — Z86718 Personal history of other venous thrombosis and embolism: Secondary | ICD-10-CM

## 2015-07-19 DIAGNOSIS — Z86711 Personal history of pulmonary embolism: Secondary | ICD-10-CM

## 2015-07-19 DIAGNOSIS — R760 Raised antibody titer: Secondary | ICD-10-CM | POA: Diagnosis not present

## 2015-07-19 DIAGNOSIS — I82422 Acute embolism and thrombosis of left iliac vein: Secondary | ICD-10-CM

## 2015-07-19 DIAGNOSIS — Z7901 Long term (current) use of anticoagulants: Secondary | ICD-10-CM

## 2015-07-19 DIAGNOSIS — D689 Coagulation defect, unspecified: Secondary | ICD-10-CM

## 2015-07-19 LAB — BASIC METABOLIC PANEL (CC13)
ANION GAP: 9 meq/L (ref 3–11)
BUN: 10 mg/dL (ref 7.0–26.0)
CO2: 21 mEq/L — ABNORMAL LOW (ref 22–29)
Calcium: 10.1 mg/dL (ref 8.4–10.4)
Chloride: 107 mEq/L (ref 98–109)
Creatinine: 0.7 mg/dL (ref 0.6–1.1)
GLUCOSE: 98 mg/dL (ref 70–140)
POTASSIUM: 4.3 meq/L (ref 3.5–5.1)
Sodium: 137 mEq/L (ref 136–145)

## 2015-07-19 LAB — CBC WITH DIFFERENTIAL (CANCER CENTER ONLY)
BASO#: 0 10*3/uL (ref 0.0–0.2)
BASO%: 0.3 % (ref 0.0–2.0)
EOS ABS: 0.1 10*3/uL (ref 0.0–0.5)
EOS%: 1 % (ref 0.0–7.0)
HCT: 38.8 % (ref 34.8–46.6)
HEMOGLOBIN: 13.3 g/dL (ref 11.6–15.9)
LYMPH#: 1.9 10*3/uL (ref 0.9–3.3)
LYMPH%: 19.8 % (ref 14.0–48.0)
MCH: 31.7 pg (ref 26.0–34.0)
MCHC: 34.3 g/dL (ref 32.0–36.0)
MCV: 93 fL (ref 81–101)
MONO#: 0.6 10*3/uL (ref 0.1–0.9)
MONO%: 6.2 % (ref 0.0–13.0)
NEUT%: 72.7 % (ref 39.6–80.0)
NEUTROS ABS: 7.1 10*3/uL — AB (ref 1.5–6.5)
Platelets: 288 10*3/uL (ref 145–400)
RBC: 4.19 10*6/uL (ref 3.70–5.32)
RDW: 13.4 % (ref 11.1–15.7)
WBC: 9.8 10*3/uL (ref 3.9–10.0)

## 2015-07-19 NOTE — Progress Notes (Signed)
Hematology and Oncology Follow Up Visit  Skeet LatchJennifer L Georgi [id number] 03/16/1976 39 y.o. 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 [name] is back for follow-up. She is doing quite well. She now is on the 150 mg Lovenox dose. She does is daily.  She's had some nausea. She's not had any morning sickness.  His been no bleeding.  She's had no change in bowel or bladder habits. Her actions had no cough. His been no chest wall pain. Her appetite has been pretty good.  She's had no rashes. There's not been any issues with leg swelling.  She had no fever.  Her next ultrasound probably will be in December.   Overall, her performance status is ECOG 0.  Medications:  Current outpatient prescriptions:  .  acetaminophen (TYLENOL) 500 MG tablet, Take 1,000 mg by mouth every 6 (six) hours as needed for pain., Disp: , Rfl:  .  enoxaparin (LOVENOX) 150 MG/ML injection, Inject 1 mL (150 mg total) into the skin daily., Disp: 30 Syringe, Rfl: 11  Allergies:  Allergies  Allergen Reactions  . Augmentin [Amoxicillin-Pot Clavulanate] Nausea And Vomiting    Past Medical History, Surgical history, Social history, and Family History were reviewed and updated.  Review of Systems: As above  Physical Exam:  height is 5\' 6"  (1.676 m) and weight is 319 lb (144.697 kg). Her oral temperature is 97.8 F (36.6 C). Her blood pressure is 130/54 and her pulse is 85. Her respiration is 16.   Wt Readings from Last 3 Encounters:  07/19/15 319 lb (144.697 kg)  06/07/15 323 lb (146.512 kg)  11/19/14 306 lb (138.801 kg)     Obese white female in no obvious distress. Head and neck exam shows no ocular or oral lesions. She has no palpable cervical or supraclavicular lymph nodes. Lungs are clear. Cardiac exam regular rate and rhythm  with no murmurs, rubs or bruits. Abdomen is soft. She has good bowel sounds. There is no fluid wave. There is no guarding or rebound tenderness. Back exam shows no tenderness over the spine, ribs or hips. Extremities shows no clubbing, cyanosis or edema. She has no venous cord in the legs. She has a negative Homans sign. Skin exam shows no rashes, ecchymoses or petechia. Neurological exam shows no focal neurological deficits.  Lab Results  Component Value Date   WBC 9.8 07/19/2015   HGB 13.3 07/19/2015   HCT 38.8 07/19/2015   MCV 93 07/19/2015   PLT 288 07/19/2015     Chemistry      Component Value Date/Time   NA 134 06/07/2015 1447   NA 136 04/29/2013 1655   K 4.6 06/07/2015 1447   K 3.8 04/29/2013 1655   CL 106 06/07/2015 1447   CL 104 04/29/2013 1655   CO2 25 06/07/2015 1447   CO2 19 04/29/2013 1655   BUN 10 06/07/2015 1447   BUN 16 04/29/2013 1655   CREATININE 0.8 06/07/2015 1447   CREATININE 0.79 04/29/2013 1655      Component Value Date/Time   CALCIUM 10.3 06/07/2015 1447   CALCIUM 9.6 04/29/2013 1655   ALKPHOS 70 06/07/2015 1447   ALKPHOS 67 12/10/2012 0041   AST 31 06/07/2015 1447   AST 19 12/10/2012 0041   ALT 39 06/07/2015 1447   ALT 31 12/10/2012 0041  BILITOT 0.50 06/07/2015 1447   BILITOT 0.2* 12/10/2012 0041         Impression and Plan: Ms. Cerullo is 39 year old white female. She is into her second trimester of pregnancy now. She is doing well. So far, the baby's he's been developing normally.  I don't find any evidence of thromboembolic disease with Ms. finan.  She is due on May 17.  I will plan to see her back probably in about 3 months now.  I don't think we have any issues with respect to the Lovenox.  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 I'm not sure what this really means. Her IgA anticardiolipin antibody is normal. All of her thrombophilic studies are normal. certainly is willing to do what she needs to do to stay healthy  during the pregnancy.   Josph Macho, MD 11/3/20163:05 PM

## 2015-09-19 ENCOUNTER — Other Ambulatory Visit (HOSPITAL_COMMUNITY): Payer: Self-pay | Admitting: Family Medicine

## 2015-09-19 DIAGNOSIS — Z3A22 22 weeks gestation of pregnancy: Secondary | ICD-10-CM

## 2015-09-19 DIAGNOSIS — O09522 Supervision of elderly multigravida, second trimester: Secondary | ICD-10-CM

## 2015-09-19 DIAGNOSIS — Z3689 Encounter for other specified antenatal screening: Secondary | ICD-10-CM

## 2015-09-19 DIAGNOSIS — O283 Abnormal ultrasonic finding on antenatal screening of mother: Secondary | ICD-10-CM

## 2015-09-26 ENCOUNTER — Encounter (HOSPITAL_COMMUNITY): Payer: Self-pay

## 2015-09-26 ENCOUNTER — Ambulatory Visit (HOSPITAL_COMMUNITY)
Admission: RE | Admit: 2015-09-26 | Discharge: 2015-09-26 | Disposition: A | Discharge status: Home / Self Care | Payer: BLUE CROSS/BLUE SHIELD | Source: Ambulatory Visit | Attending: Family Medicine | Admitting: Family Medicine

## 2015-09-26 ENCOUNTER — Other Ambulatory Visit (HOSPITAL_COMMUNITY): Payer: Self-pay | Admitting: Family Medicine

## 2015-09-26 DIAGNOSIS — Z36 Encounter for antenatal screening of mother: Secondary | ICD-10-CM | POA: Diagnosis not present

## 2015-09-26 DIAGNOSIS — O09522 Supervision of elderly multigravida, second trimester: Secondary | ICD-10-CM

## 2015-09-26 DIAGNOSIS — Z3A22 22 weeks gestation of pregnancy: Secondary | ICD-10-CM | POA: Diagnosis not present

## 2015-09-26 DIAGNOSIS — O283 Abnormal ultrasonic finding on antenatal screening of mother: Secondary | ICD-10-CM

## 2015-09-26 DIAGNOSIS — Z3689 Encounter for other specified antenatal screening: Secondary | ICD-10-CM

## 2015-09-26 DIAGNOSIS — O99212 Obesity complicating pregnancy, second trimester: Secondary | ICD-10-CM | POA: Diagnosis not present

## 2015-10-05 ENCOUNTER — Other Ambulatory Visit (HOSPITAL_COMMUNITY): Payer: Self-pay

## 2015-10-05 ENCOUNTER — Other Ambulatory Visit (HOSPITAL_COMMUNITY): Payer: Self-pay | Admitting: Advanced Practice Midwife

## 2015-10-05 DIAGNOSIS — Z0489 Encounter for examination and observation for other specified reasons: Secondary | ICD-10-CM

## 2015-10-05 DIAGNOSIS — O283 Abnormal ultrasonic finding on antenatal screening of mother: Secondary | ICD-10-CM

## 2015-10-05 DIAGNOSIS — IMO0002 Reserved for concepts with insufficient information to code with codable children: Secondary | ICD-10-CM

## 2015-10-05 DIAGNOSIS — Z3A28 28 weeks gestation of pregnancy: Secondary | ICD-10-CM

## 2015-10-22 ENCOUNTER — Ambulatory Visit (HOSPITAL_BASED_OUTPATIENT_CLINIC_OR_DEPARTMENT_OTHER): Payer: BLUE CROSS/BLUE SHIELD | Admitting: Hematology & Oncology

## 2015-10-22 ENCOUNTER — Other Ambulatory Visit (HOSPITAL_BASED_OUTPATIENT_CLINIC_OR_DEPARTMENT_OTHER): Payer: BLUE CROSS/BLUE SHIELD

## 2015-10-22 ENCOUNTER — Encounter: Payer: Self-pay | Admitting: Hematology & Oncology

## 2015-10-22 VITALS — BP 137/72 | HR 95 | Temp 98.0°F | Resp 16 | Ht 66.0 in | Wt 312.0 lb

## 2015-10-22 DIAGNOSIS — O2292 Venous complication in pregnancy, unspecified, second trimester: Secondary | ICD-10-CM

## 2015-10-22 DIAGNOSIS — I82422 Acute embolism and thrombosis of left iliac vein: Secondary | ICD-10-CM

## 2015-10-22 DIAGNOSIS — I2699 Other pulmonary embolism without acute cor pulmonale: Secondary | ICD-10-CM

## 2015-10-22 DIAGNOSIS — I82432 Acute embolism and thrombosis of left popliteal vein: Secondary | ICD-10-CM

## 2015-10-22 LAB — CBC WITH DIFFERENTIAL (CANCER CENTER ONLY)
BASO#: 0 10*3/uL (ref 0.0–0.2)
BASO%: 0.3 % (ref 0.0–2.0)
EOS ABS: 0.1 10*3/uL (ref 0.0–0.5)
EOS%: 1.2 % (ref 0.0–7.0)
HCT: 35.8 % (ref 34.8–46.6)
HGB: 12.1 g/dL (ref 11.6–15.9)
LYMPH#: 1.8 10*3/uL (ref 0.9–3.3)
LYMPH%: 15.4 % (ref 14.0–48.0)
MCH: 31.8 pg (ref 26.0–34.0)
MCHC: 33.8 g/dL (ref 32.0–36.0)
MCV: 94 fL (ref 81–101)
MONO#: 0.6 10*3/uL (ref 0.1–0.9)
MONO%: 5.1 % (ref 0.0–13.0)
NEUT#: 9.4 10*3/uL — ABNORMAL HIGH (ref 1.5–6.5)
NEUT%: 78 % (ref 39.6–80.0)
PLATELETS: 300 10*3/uL (ref 145–400)
RBC: 3.8 10*6/uL (ref 3.70–5.32)
RDW: 14.1 % (ref 11.1–15.7)
WBC: 12 10*3/uL — AB (ref 3.9–10.0)

## 2015-10-22 LAB — BASIC METABOLIC PANEL (CC13)
BUN / CREAT RATIO: 11 (ref 8–20)
BUN: 8 mg/dL (ref 6–20)
CHLORIDE: 108 mmol/L — AB (ref 96–106)
Calcium, Ser: 10 mg/dL (ref 8.7–10.2)
Carbon Dioxide, Total: 22 mmol/L (ref 18–29)
Creatinine, Ser: 0.72 mg/dL (ref 0.57–1.00)
GFR calc non Af Amer: 107 mL/min/{1.73_m2} (ref >59)
GFR, EST AFRICAN AMERICAN: 123 mL/min/{1.73_m2} (ref >59)
GLUCOSE: 147 mg/dL — AB (ref 65–99)
POTASSIUM: 4.5 mmol/L (ref 3.5–5.2)
SODIUM: 140 mmol/L (ref 134–144)

## 2015-10-22 LAB — TECHNOLOGIST REVIEW CHCC SATELLITE

## 2015-10-22 NOTE — Progress Notes (Signed)
Hematology and Oncology Follow Up Visit  Betty Perkins [id number] 1975/10/25 40 y.o. 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 is back for follow-up. She is doing quite well. She now is tolerating the 150 mg Lovenox dose. She injects himself daily..  So far, her baby is developing normally. She will have a baby girl. Apparently there is a cyst on one of her ovaries. I'm unsure how the tell this on a ultrasound of the fetus but they can.Mrs. Betty Perkins is not sure if anything needs to be done for this.   She has had a few nosebleeds. This seems to spontaneously.   She's had no ecchymoses. She's had no leg swelling. She's had no rashes. She had no cough or shortness of breath.   She goes for an ultrasound monthly.   She is still due for her delivery on May 17. I'm sure that we will have to switch her over to heparin about a month beforehand.  Overall, her performance status is ECOG 0.  Medications:  Current outpatient prescriptions:  .  Prenatal Vit-Fe Fumarate-FA (MULTIVITAMIN-PRENATAL) 27-0.8 MG TABS tablet, Take 1 tablet by mouth daily at 12 noon., Disp: , Rfl:  .  acetaminophen (TYLENOL) 500 MG tablet, Take 1,000 mg by mouth every 6 (six) hours as needed for pain., Disp: , Rfl:  .  enoxaparin (LOVENOX) 150 MG/ML injection, Inject 1 mL (150 mg total) into the skin daily., Disp: 30 Syringe, Rfl: 11  Allergies:  Allergies  Allergen Reactions  . Augmentin [Amoxicillin-Pot Clavulanate] Nausea And Vomiting    Past Medical History, Surgical history, Social history, and Family History were reviewed and updated.  Review of Systems: As above  Physical Exam:  height is  (1.676 m) and weight is 312 lb (141.522 kg). Her oral temperature is 98 F (36.7 C). Her blood pressure is 137/72 and her  pulse is 95. Her respiration is 16.   Wt Readings from Last 3 Encounters:  10/22/15 312 lb (141.522 kg)  09/26/15 314 lb 3.2 oz (142.52 kg)  07/19/15 319 lb (144.697 kg)     Obese white female in no obvious distress. Head and neck exam shows no ocular or oral lesions. She has no palpable cervical or supraclavicular lymph nodes. Lungs are clear. Cardiac exam regular rate and rhythm with no murmurs, rubs or bruits. Abdomen is soft. She really is not showing her pregnancy yet. She has good bowel sounds. There is no fluid wave. There is no guarding or rebound tenderness. Back exam shows no tenderness over the spine, ribs or hips. Extremities shows no clubbing, cyanosis or edema. She has no venous cord in the legs. She has a negative Homans sign. Skin exam shows no rashes, ecchymoses or petechia. Neurological exam shows no focal neurological deficits.  Lab Results  Component Value Date   WBC 12.0* 10/22/2015   HGB 12.1 10/22/2015   HCT 35.8 10/22/2015   MCV 94 10/22/2015   PLT 300 10/22/2015     Chemistry      Component Value Date/Time   NA 140 10/22/2015 1517   NA 137 07/19/2015 1416   NA 134 06/07/2015 1447   NA 136 04/29/2013 1655   K 4.5 10/22/2015 1517   K 4.3 07/19/2015 1416   K 4.6 06/07/2015 1447   K  3.8 04/29/2013 1655   CL 108* 10/22/2015 1517   CL 106 06/07/2015 1447   CL 104 04/29/2013 1655   CO2 22 10/22/2015 1517   CO2 21* 07/19/2015 1416   CO2 25 06/07/2015 1447   CO2 19 04/29/2013 1655   BUN 8 10/22/2015 1517   BUN 10.0 07/19/2015 1416   BUN 10 06/07/2015 1447   BUN 16 04/29/2013 1655   CREATININE 0.72 10/22/2015 1517   CREATININE 0.7 07/19/2015 1416   CREATININE 0.8 06/07/2015 1447   CREATININE 0.79 04/29/2013 1655      Component Value Date/Time   CALCIUM 10.0 10/22/2015 1517   CALCIUM 10.1 07/19/2015 1416   CALCIUM 10.3 06/07/2015 1447   CALCIUM 9.6 04/29/2013 1655   ALKPHOS 70 06/07/2015 1447   ALKPHOS 67 12/10/2012 0041   AST 31 06/07/2015 1447    AST 19 12/10/2012 0041   ALT 39 06/07/2015 1447   ALT 31 12/10/2012 0041   BILITOT 0.50 06/07/2015 1447   BILITOT 0.2* 12/10/2012 0041         Impression and Plan: [name] is 40 year old white female. She is into her second trimester of pregnancy now. She is doing well. So far, the baby's he's been developing normally.  I don't find any evidence of thromboembolic disease with [name].  She is due on May 17.  I will plan to see her back probably in about 2 months now. At that point time, we probably will have to consider switching over to therapeutic heparin.  I don't think we have any issues with respect to the Lovenox.  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 I'm not sure what this really means. Her IgA anticardiolipin antibody is normal. All of her thrombophilic studies are normal. .   Josph Macho, MD 2/6/20175:58 PM

## 2015-11-07 ENCOUNTER — Encounter (HOSPITAL_COMMUNITY): Payer: Self-pay

## 2015-11-07 ENCOUNTER — Ambulatory Visit (HOSPITAL_COMMUNITY)
Admission: RE | Admit: 2015-11-07 | Discharge: 2015-11-07 | Disposition: A | Discharge status: Home / Self Care | Payer: BLUE CROSS/BLUE SHIELD | Source: Ambulatory Visit | Attending: Advanced Practice Midwife | Admitting: Advanced Practice Midwife

## 2015-11-07 DIAGNOSIS — IMO0002 Reserved for concepts with insufficient information to code with codable children: Secondary | ICD-10-CM

## 2015-11-07 DIAGNOSIS — O2693 Pregnancy related conditions, unspecified, third trimester: Secondary | ICD-10-CM | POA: Diagnosis not present

## 2015-11-07 DIAGNOSIS — O99213 Obesity complicating pregnancy, third trimester: Secondary | ICD-10-CM | POA: Insufficient documentation

## 2015-11-07 DIAGNOSIS — Z36 Encounter for antenatal screening of mother: Secondary | ICD-10-CM | POA: Diagnosis present

## 2015-11-07 DIAGNOSIS — Z3A28 28 weeks gestation of pregnancy: Secondary | ICD-10-CM | POA: Insufficient documentation

## 2015-11-07 DIAGNOSIS — O283 Abnormal ultrasonic finding on antenatal screening of mother: Secondary | ICD-10-CM

## 2015-11-07 DIAGNOSIS — Z0489 Encounter for examination and observation for other specified reasons: Secondary | ICD-10-CM

## 2015-11-07 DIAGNOSIS — O09523 Supervision of elderly multigravida, third trimester: Secondary | ICD-10-CM | POA: Diagnosis not present

## 2015-12-18 DIAGNOSIS — O99213 Obesity complicating pregnancy, third trimester: Secondary | ICD-10-CM | POA: Diagnosis not present

## 2015-12-18 DIAGNOSIS — Z3A33 33 weeks gestation of pregnancy: Secondary | ICD-10-CM | POA: Diagnosis not present

## 2015-12-24 ENCOUNTER — Other Ambulatory Visit (HOSPITAL_BASED_OUTPATIENT_CLINIC_OR_DEPARTMENT_OTHER): Payer: BLUE CROSS/BLUE SHIELD

## 2015-12-24 ENCOUNTER — Ambulatory Visit (HOSPITAL_BASED_OUTPATIENT_CLINIC_OR_DEPARTMENT_OTHER): Payer: BLUE CROSS/BLUE SHIELD | Admitting: Family

## 2015-12-24 ENCOUNTER — Encounter: Payer: Self-pay | Admitting: Hematology & Oncology

## 2015-12-24 VITALS — BP 137/83 | HR 89 | Temp 98.3°F | Resp 16 | Ht 66.0 in | Wt 314.0 lb

## 2015-12-24 DIAGNOSIS — O2293 Venous complication in pregnancy, unspecified, third trimester: Secondary | ICD-10-CM

## 2015-12-24 DIAGNOSIS — I82422 Acute embolism and thrombosis of left iliac vein: Secondary | ICD-10-CM | POA: Diagnosis not present

## 2015-12-24 DIAGNOSIS — I2699 Other pulmonary embolism without acute cor pulmonale: Secondary | ICD-10-CM

## 2015-12-24 DIAGNOSIS — Z3493 Encounter for supervision of normal pregnancy, unspecified, third trimester: Secondary | ICD-10-CM

## 2015-12-24 DIAGNOSIS — I82432 Acute embolism and thrombosis of left popliteal vein: Secondary | ICD-10-CM

## 2015-12-24 LAB — CBC WITH DIFFERENTIAL (CANCER CENTER ONLY)
BASO#: 0 10*3/uL (ref 0.0–0.2)
BASO%: 0.3 % (ref 0.0–2.0)
EOS%: 0.9 % (ref 0.0–7.0)
Eosinophils Absolute: 0.1 10*3/uL (ref 0.0–0.5)
HEMATOCRIT: 36.6 % (ref 34.8–46.6)
HEMOGLOBIN: 12.6 g/dL (ref 11.6–15.9)
LYMPH#: 1.7 10*3/uL (ref 0.9–3.3)
LYMPH%: 17.9 % (ref 14.0–48.0)
MCH: 32.2 pg (ref 26.0–34.0)
MCHC: 34.4 g/dL (ref 32.0–36.0)
MCV: 94 fL (ref 81–101)
MONO#: 0.5 10*3/uL (ref 0.1–0.9)
MONO%: 5.3 % (ref 0.0–13.0)
NEUT%: 75.6 % (ref 39.6–80.0)
NEUTROS ABS: 7.1 10*3/uL — AB (ref 1.5–6.5)
Platelets: 256 10*3/uL (ref 145–400)
RBC: 3.91 10*6/uL (ref 3.70–5.32)
RDW: 14.5 % (ref 11.1–15.7)
WBC: 9.4 10*3/uL (ref 3.9–10.0)

## 2015-12-24 LAB — COMPREHENSIVE METABOLIC PANEL (CC13)
ALBUMIN: 3.2 g/dL — AB (ref 3.5–5.5)
ALK PHOS: 154 IU/L — AB (ref 39–117)
ALT: 15 IU/L (ref 0–32)
AST (SGOT): 12 IU/L (ref 0–40)
Albumin/Globulin Ratio: 1 — ABNORMAL LOW (ref 1.2–2.2)
BUN / CREAT RATIO: 11 (ref 9–23)
BUN: 7 mg/dL (ref 6–20)
CHLORIDE: 106 mmol/L (ref 96–106)
CO2: 20 mmol/L (ref 18–29)
CREATININE: 0.62 mg/dL (ref 0.57–1.00)
Calcium, Ser: 10 mg/dL (ref 8.7–10.2)
GFR calc Af Amer: 132 mL/min/{1.73_m2} (ref >59)
GFR calc non Af Amer: 115 mL/min/{1.73_m2} (ref >59)
GLUCOSE: 139 mg/dL — AB (ref 65–99)
Globulin, Total: 3.2 g/dL (ref 1.5–4.5)
Potassium, Ser: 4.2 mmol/L (ref 3.5–5.2)
Sodium: 138 mmol/L (ref 134–144)
TOTAL PROTEIN: 6.4 g/dL (ref 6.0–8.5)

## 2015-12-24 LAB — TECHNOLOGIST REVIEW CHCC SATELLITE

## 2015-12-24 LAB — CHCC SATELLITE - SMEAR

## 2015-12-24 MED ORDER — HEPARIN SODIUM (PORCINE) 10000 UNIT/10ML IV SOSY: 10000.0000 [IU] | PREFILLED_SYRINGE | Freq: Two times a day (BID) | INTRAVENOUS | Status: DC

## 2015-12-24 NOTE — Progress Notes (Signed)
Hematology and Oncology Follow Up Visit  Betty Perkins [id number] Aug 12, 1976 40 y.o. 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 is here today for a follow-up. She and the baby are doing well. She denies having any more episodes of bleeding besides the two nose bleeds which she was able to stop quickly on her own. She has some bruising across her abdomen from the Lovenox injections. No petechiae.  She will be 35 weeks on Thursday and sees her OB that same day.  She has had no contractions so far. She has had some mild fatigue. No fever, chills, n/v, cough, rash, dizziness, SOB, chest pain, palpitations, abdominal pain or changes in bowel or bladder habits.  She has some mild swelling in her ankles and feet that comes and goes. No tenderness, numbness or tingling in her extremities.  She has a good appetite and is staying well hydrated. Her weight is stable.   Medications:    Medication List       This list is accurate as of: 12/24/15  3:49 PM.  Always use your most recent med list.               acetaminophen 500 MG tablet  Commonly known as:  TYLENOL  Take 1,000 mg by mouth every 6 (six) hours as needed for pain.     enoxaparin 150 MG/ML injection  Commonly known as:  LOVENOX  Inject 1 mL (150 mg total) into the skin daily.     multivitamin-prenatal 27-0.8 MG Tabs tablet  Take 1 tablet by mouth daily at 12 noon.        Allergies:  Allergies  Allergen Reactions  . Augmentin [Amoxicillin-Pot Clavulanate] Nausea And Vomiting    Past Medical History, Surgical history, Social history, and Family History were reviewed and updated.  Review of Systems: All other 10 point review of systems is negative.   Physical Exam:  height is 5\' 6"  (1.676 m) and weight is 314 lb (142.429 kg). Her oral temperature is  98.3 F (36.8 C). Her blood pressure is 137/83 and her pulse is 89. Her respiration is 16.   Wt Readings from Last 3 Encounters:  12/24/15 314 lb (142.429 kg)  11/07/15 314 lb (142.429 kg)  10/22/15 312 lb (141.522 kg)    Ocular: Sclerae unicteric, pupils equal, round and reactive to light Ear-nose-throat: Oropharynx clear, dentition fair Lymphatic: No cervical supraclavicular or axillary adenopathy Lungs no rales or rhonchi, good excursion bilaterally Heart regular rate and rhythm, no murmur appreciated Abd soft, nontender, positive bowel sounds, no liver or spleen tip palpated on exam, no fluid wave MSK no focal spinal tenderness, no joint edema Neuro: non-focal, well-oriented, appropriate affect Breasts: Deferred  Lab Results  Component Value Date   WBC 9.4 12/24/2015   HGB 12.6 12/24/2015   HCT 36.6 12/24/2015   MCV 94 12/24/2015   PLT 256 12/24/2015   No results found for: FERRITIN, IRON, TIBC, UIBC, IRONPCTSAT Lab Results  Component Value Date   RBC 3.91 12/24/2015   No results found for: KPAFRELGTCHN, LAMBDASER, KAPLAMBRATIO No results found for: IGGSERUM, IGA, IGMSERUM No results found for: Dorene ArOTALPROTELP, ALBUMINELP, A1GS, A2GS, Colin BentonBETS, BETA2SER, GAMS, MSPIKE, SPEI   Chemistry      Component Value Date/Time   NA 140 10/22/2015 1517   NA 137 07/19/2015 1416  NA 134 06/07/2015 1447   NA 136 04/29/2013 1655   K 4.5 10/22/2015 1517   K 4.3 07/19/2015 1416   K 4.6 06/07/2015 1447   K 3.8 04/29/2013 1655   CL 108* 10/22/2015 1517   CL 106 06/07/2015 1447   CL 104 04/29/2013 1655   CO2 22 10/22/2015 1517   CO2 21* 07/19/2015 1416   CO2 25 06/07/2015 1447   CO2 19 04/29/2013 1655   BUN 8 10/22/2015 1517   BUN 10.0 07/19/2015 1416   BUN 10 06/07/2015 1447   BUN 16 04/29/2013 1655   CREATININE 0.72 10/22/2015 1517   CREATININE 0.7 07/19/2015 1416   CREATININE 0.8 06/07/2015 1447   CREATININE 0.79 04/29/2013 1655      Component Value Date/Time   CALCIUM 10.0  10/22/2015 1517   CALCIUM 10.1 07/19/2015 1416   CALCIUM 10.3 06/07/2015 1447   CALCIUM 9.6 04/29/2013 1655   ALKPHOS 70 06/07/2015 1447   ALKPHOS 67 12/10/2012 0041   AST 31 06/07/2015 1447   AST 19 12/10/2012 0041   ALT 39 06/07/2015 1447   ALT 31 12/10/2012 0041   BILITOT 0.50 06/07/2015 1447   BILITOT 0.2* 12/10/2012 0041     Impression and Plan: [name] is 40 yo white female with history of thromboembolic disease. She is now in the third trimester of her pregnancy and doing well. She has done well on Lovenox and will finish her prescription for the month before switching to Heparin 10,000 units BID per Dr. Myna Hidalgo.  Her due date is May 17th so we will have her stop her injections on May 16th. At this time she states that they are still planning on a natural delivery.  She will resume Lovenox injections after birth for 6 weeks.  We will plan to see her back in 4 weeks unless she goes into labor and delivers early.  She will contact us with any questions or concerns. We can certainly see her sooner if need be.   [name], NP 4/10/20173:49 PM

## 2015-12-26 DIAGNOSIS — Z3A35 35 weeks gestation of pregnancy: Secondary | ICD-10-CM | POA: Diagnosis not present

## 2015-12-26 DIAGNOSIS — O99213 Obesity complicating pregnancy, third trimester: Secondary | ICD-10-CM | POA: Diagnosis not present

## 2015-12-31 DIAGNOSIS — Z3A35 35 weeks gestation of pregnancy: Secondary | ICD-10-CM | POA: Diagnosis not present

## 2015-12-31 DIAGNOSIS — O99213 Obesity complicating pregnancy, third trimester: Secondary | ICD-10-CM | POA: Diagnosis not present

## 2016-01-02 DIAGNOSIS — Z3A36 36 weeks gestation of pregnancy: Secondary | ICD-10-CM | POA: Diagnosis not present

## 2016-01-02 LAB — OB RESULTS CONSOLE GBS: GBS: POSITIVE

## 2016-01-08 DIAGNOSIS — Z3A36 36 weeks gestation of pregnancy: Secondary | ICD-10-CM | POA: Diagnosis not present

## 2016-01-08 DIAGNOSIS — O99213 Obesity complicating pregnancy, third trimester: Secondary | ICD-10-CM | POA: Diagnosis not present

## 2016-01-16 DIAGNOSIS — O99213 Obesity complicating pregnancy, third trimester: Secondary | ICD-10-CM | POA: Diagnosis not present

## 2016-01-16 DIAGNOSIS — Z3A38 38 weeks gestation of pregnancy: Secondary | ICD-10-CM | POA: Diagnosis not present

## 2016-01-21 ENCOUNTER — Encounter: Payer: Self-pay | Admitting: Family

## 2016-01-21 ENCOUNTER — Ambulatory Visit (HOSPITAL_BASED_OUTPATIENT_CLINIC_OR_DEPARTMENT_OTHER): Payer: BLUE CROSS/BLUE SHIELD | Admitting: Family

## 2016-01-21 ENCOUNTER — Other Ambulatory Visit (HOSPITAL_BASED_OUTPATIENT_CLINIC_OR_DEPARTMENT_OTHER): Payer: BLUE CROSS/BLUE SHIELD

## 2016-01-21 VITALS — BP 135/93 | HR 88 | Temp 97.6°F | Resp 18 | Ht 66.0 in | Wt 311.0 lb

## 2016-01-21 DIAGNOSIS — Z3493 Encounter for supervision of normal pregnancy, unspecified, third trimester: Secondary | ICD-10-CM

## 2016-01-21 DIAGNOSIS — I82432 Acute embolism and thrombosis of left popliteal vein: Secondary | ICD-10-CM

## 2016-01-21 DIAGNOSIS — O2293 Venous complication in pregnancy, unspecified, third trimester: Secondary | ICD-10-CM

## 2016-01-21 DIAGNOSIS — I2699 Other pulmonary embolism without acute cor pulmonale: Secondary | ICD-10-CM

## 2016-01-21 LAB — CBC WITH DIFFERENTIAL (CANCER CENTER ONLY)
BASO#: 0 10*3/uL (ref 0.0–0.2)
BASO%: 0.2 % (ref 0.0–2.0)
EOS%: 0.7 % (ref 0.0–7.0)
Eosinophils Absolute: 0.1 10*3/uL (ref 0.0–0.5)
HEMATOCRIT: 37.1 % (ref 34.8–46.6)
HGB: 12.8 g/dL (ref 11.6–15.9)
LYMPH#: 1.5 10*3/uL (ref 0.9–3.3)
LYMPH%: 17.6 % (ref 14.0–48.0)
MCH: 32.4 pg (ref 26.0–34.0)
MCHC: 34.5 g/dL (ref 32.0–36.0)
MCV: 94 fL (ref 81–101)
MONO#: 0.4 10*3/uL (ref 0.1–0.9)
MONO%: 5.1 % (ref 0.0–13.0)
NEUT#: 6.5 10*3/uL (ref 1.5–6.5)
NEUT%: 76.4 % (ref 39.6–80.0)
Platelets: 236 10*3/uL (ref 145–400)
RBC: 3.95 10*6/uL (ref 3.70–5.32)
RDW: 14.5 % (ref 11.1–15.7)
WBC: 8.5 10*3/uL (ref 3.9–10.0)

## 2016-01-21 LAB — PROTIME-INR (CHCC SATELLITE)
INR: 0.9 — ABNORMAL LOW (ref 2.0–3.5)
Protime: 10.8 Seconds (ref 10.6–13.4)

## 2016-01-21 MED ORDER — HEPARIN SODIUM (PORCINE) 10000 UNIT/10ML IV SOSY: 10000.0000 [IU] | PREFILLED_SYRINGE | Freq: Two times a day (BID) | INTRAVENOUS | Status: DC

## 2016-01-21 NOTE — Progress Notes (Signed)
Hematology and Oncology Follow Up Visit  Betty Perkins 1975/12/05 40 y.o. 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 She and the baby are doing well. She is now 38 weeks and 6 days pregnant. She is doing well on Heparin and has had no episodes of bleeding or bruising. She would like to resume Heparin after she gives birth since the co pay is much cheaper.    No fever, chills, n/v, cough, rash, dizziness, SOB, chest pain, palpitations, abdominal pain or changes in bowel or bladder habits.  No swelling, tenderness, numbness or tingling in her extremities.  She has a good appetite and is staying well hydrated. Her weight is stable.   Medications:    Medication List       This list is accurate as of: 01/21/16  2:51 PM.  Always use your most recent med list.               acetaminophen 500 MG tablet  Commonly known as:  TYLENOL  Take 1,000 mg by mouth every 6 (six) hours as needed for pain.     Heparin Sodium (Porcine) 1660610000 UNIT/10ML Sosy  Inject 10,000 Units into the skin 2 (two) times daily.     multivitamin-prenatal 27-0.8 MG Tabs tablet  Take 1 tablet by mouth daily at 12 noon.        Allergies:  Allergies  Allergen Reactions  . Augmentin [Amoxicillin-Pot Clavulanate] Nausea And Vomiting    Past Medical History, Surgical history, Social history, and Family History were reviewed and updated.  Review of Systems: All other 10 point review of systems is negative.   Physical Exam:  height is 5\' 6"  (1.676 m) and weight is 311 lb (141.069 kg). Her oral temperature is 97.6 F (36.4 C). Her blood pressure is 135/93 and her pulse is 88. Her respiration is 18.   Wt Readings from Last 3 Encounters:  01/21/16 311 lb (141.069 kg)    12/24/15 314 lb (142.429 kg)  11/07/15 314 lb (142.429 kg)    Ocular: Sclerae unicteric, pupils equal, round and reactive to light Ear-nose-throat: Oropharynx clear, dentition fair Lymphatic: No cervical supraclavicular or axillary adenopathy Lungs no rales or rhonchi, good excursion bilaterally Heart regular rate and rhythm, no murmur appreciated Abd soft, nontender, positive bowel sounds, no liver or spleen tip palpated on exam, no fluid wave MSK no focal spinal tenderness, no joint edema Neuro: non-focal, well-oriented, appropriate affect Breasts: Deferred  Lab Results  Component Value Date   WBC 8.5 01/21/2016   HGB 12.8 01/21/2016   HCT 37.1 01/21/2016   MCV 94 01/21/2016   PLT 236 01/21/2016   No results found for: FERRITIN, IRON, TIBC, UIBC, IRONPCTSAT Lab Results  Component Value Date   RBC 3.95 01/21/2016   No results found for: KPAFRELGTCHN, LAMBDASER, KAPLAMBRATIO No results found for: IGGSERUM, IGA, IGMSERUM No results found for: Marda StalkerOTALPROTELP, ALBUMINELP, A1GS, A2GS, BETS, BETA2SER, GAMS, MSPIKE, SPEI   Chemistry      Component Value Date/Time   NA 138 12/24/2015 1504   NA 137 07/19/2015 1416   NA 134 06/07/2015 1447   NA 136 04/29/2013 1655   K 4.2 12/24/2015 1504   K 4.3 07/19/2015 1416   K 4.6 06/07/2015 1447  K 3.8 04/29/2013 1655   CL 106 12/24/2015 1504   CL 106 06/07/2015 1447   CL 104 04/29/2013 1655   CO2 20 12/24/2015 1504   CO2 21* 07/19/2015 1416   CO2 25 06/07/2015 1447   CO2 19 04/29/2013 1655   BUN 7 12/24/2015 1504   BUN 10.0 07/19/2015 1416   BUN 10 06/07/2015 1447   BUN 16 04/29/2013 1655   CREATININE 0.62 12/24/2015 1504   CREATININE 0.7 07/19/2015 1416   CREATININE 0.8 06/07/2015 1447   CREATININE 0.79 04/29/2013 1655      Component Value Date/Time   CALCIUM 10.0 12/24/2015 1504   CALCIUM 10.1 07/19/2015 1416   CALCIUM 10.3 06/07/2015 1447   CALCIUM 9.6 04/29/2013 1655   ALKPHOS 154* 12/24/2015 1504   ALKPHOS 70  06/07/2015 1447   ALKPHOS 67 12/10/2012 0041   AST 12 12/24/2015 1504   AST 31 06/07/2015 1447   AST 19 12/10/2012 0041   ALT 15 12/24/2015 1504   ALT 39 06/07/2015 1447   ALT 31 12/10/2012 0041   BILITOT <0.2 12/24/2015 1504   BILITOT 0.50 06/07/2015 1447   BILITOT 0.2* 12/10/2012 0041     Impression and Plan: Betty Perkins is 40 yo white female with history of thromboembolic disease. She is now almost [redacted] weeks pregnant. She and the baby are doing well. She has had no problems on Heparin. No bleeding or bruising. She will stop the Heparin on May 16th, the day before her due date.  She will then resume Heparin 10,000 units SQ BID after birth for 6 weeks.  We will plan to see her back in 8 weeks for follow-up.  She will contact us with any questions or concerns. We can certainly see her sooner if need be.   [name], NP 5/8/20172:51 PM

## 2016-01-22 LAB — APTT: aPTT: 29 s (ref 24–33)

## 2016-01-23 DIAGNOSIS — O99213 Obesity complicating pregnancy, third trimester: Secondary | ICD-10-CM | POA: Diagnosis not present

## 2016-01-23 DIAGNOSIS — Z3A39 39 weeks gestation of pregnancy: Secondary | ICD-10-CM | POA: Diagnosis not present

## 2016-01-29 ENCOUNTER — Encounter (HOSPITAL_COMMUNITY): Payer: Self-pay

## 2016-01-29 ENCOUNTER — Inpatient Hospital Stay (HOSPITAL_COMMUNITY)
Admission: AD | Admit: 2016-01-29 | Discharge: 2016-02-02 | DRG: 765 | Disposition: A | Discharge status: Home / Self Care | Payer: BLUE CROSS/BLUE SHIELD | Source: Ambulatory Visit | Attending: Obstetrics and Gynecology | Admitting: Obstetrics and Gynecology

## 2016-01-29 DIAGNOSIS — O134 Gestational [pregnancy-induced] hypertension without significant proteinuria, complicating childbirth: Principal | ICD-10-CM | POA: Diagnosis present

## 2016-01-29 DIAGNOSIS — O9081 Anemia of the puerperium: Secondary | ICD-10-CM | POA: Diagnosis not present

## 2016-01-29 DIAGNOSIS — Z98891 History of uterine scar from previous surgery: Secondary | ICD-10-CM

## 2016-01-29 DIAGNOSIS — O99214 Obesity complicating childbirth: Secondary | ICD-10-CM | POA: Diagnosis not present

## 2016-01-29 DIAGNOSIS — Z87891 Personal history of nicotine dependence: Secondary | ICD-10-CM | POA: Diagnosis not present

## 2016-01-29 DIAGNOSIS — Z8249 Family history of ischemic heart disease and other diseases of the circulatory system: Secondary | ICD-10-CM | POA: Diagnosis not present

## 2016-01-29 DIAGNOSIS — Z86711 Personal history of pulmonary embolism: Secondary | ICD-10-CM | POA: Diagnosis not present

## 2016-01-29 DIAGNOSIS — O99824 Streptococcus B carrier state complicating childbirth: Secondary | ICD-10-CM | POA: Diagnosis not present

## 2016-01-29 DIAGNOSIS — Z8672 Personal history of thrombophlebitis: Secondary | ICD-10-CM | POA: Diagnosis not present

## 2016-01-29 DIAGNOSIS — Z86718 Personal history of other venous thrombosis and embolism: Secondary | ICD-10-CM | POA: Diagnosis not present

## 2016-01-29 DIAGNOSIS — Z823 Family history of stroke: Secondary | ICD-10-CM

## 2016-01-29 DIAGNOSIS — N838 Other noninflammatory disorders of ovary, fallopian tube and broad ligament: Secondary | ICD-10-CM | POA: Diagnosis present

## 2016-01-29 DIAGNOSIS — Z3403 Encounter for supervision of normal first pregnancy, third trimester: Secondary | ICD-10-CM | POA: Diagnosis not present

## 2016-01-29 DIAGNOSIS — O1093 Unspecified pre-existing hypertension complicating the puerperium: Secondary | ICD-10-CM | POA: Diagnosis not present

## 2016-01-29 DIAGNOSIS — D649 Anemia, unspecified: Secondary | ICD-10-CM | POA: Diagnosis not present

## 2016-01-29 DIAGNOSIS — O3483 Maternal care for other abnormalities of pelvic organs, third trimester: Secondary | ICD-10-CM | POA: Diagnosis present

## 2016-01-29 DIAGNOSIS — Z3A40 40 weeks gestation of pregnancy: Secondary | ICD-10-CM

## 2016-01-29 DIAGNOSIS — Z833 Family history of diabetes mellitus: Secondary | ICD-10-CM

## 2016-01-29 DIAGNOSIS — Z3A39 39 weeks gestation of pregnancy: Secondary | ICD-10-CM | POA: Diagnosis not present

## 2016-01-29 DIAGNOSIS — O139 Gestational [pregnancy-induced] hypertension without significant proteinuria, unspecified trimester: Secondary | ICD-10-CM | POA: Diagnosis present

## 2016-01-29 DIAGNOSIS — Z6841 Body Mass Index (BMI) 40.0 and over, adult: Secondary | ICD-10-CM

## 2016-01-29 DIAGNOSIS — O133 Gestational [pregnancy-induced] hypertension without significant proteinuria, third trimester: Secondary | ICD-10-CM | POA: Diagnosis not present

## 2016-01-29 DIAGNOSIS — O99213 Obesity complicating pregnancy, third trimester: Secondary | ICD-10-CM | POA: Diagnosis not present

## 2016-01-29 LAB — CBC
HCT: 37.5 % (ref 36.0–46.0)
Hemoglobin: 12.9 g/dL (ref 12.0–15.0)
MCH: 32 pg (ref 26.0–34.0)
MCHC: 34.4 g/dL (ref 30.0–36.0)
MCV: 93.1 fL (ref 78.0–100.0)
PLATELETS: 251 10*3/uL (ref 150–400)
RBC: 4.03 MIL/uL (ref 3.87–5.11)
RDW: 14.9 % (ref 11.5–15.5)
WBC: 11.9 10*3/uL — AB (ref 4.0–10.5)

## 2016-01-29 LAB — TYPE AND SCREEN
ABO/RH(D): A POS
ANTIBODY SCREEN: NEGATIVE

## 2016-01-29 MED ORDER — TERBUTALINE SULFATE 1 MG/ML IJ SOLN: 0.2500 mg | Freq: Once | INTRAMUSCULAR | Status: DC | PRN

## 2016-01-29 MED ORDER — OXYTOCIN 40 UNITS IN LACTATED RINGERS INFUSION - SIMPLE MED: 2.5000 [IU]/h | INTRAVENOUS | Status: DC

## 2016-01-29 MED ORDER — OXYTOCIN 40 UNITS IN LACTATED RINGERS INFUSION - SIMPLE MED
1.0000 m[IU]/min | INTRAVENOUS | Status: DC
  Administered 2016-01-30: 2 m[IU]/min via INTRAVENOUS
  Filled 2016-01-29: qty 1000

## 2016-01-29 MED ORDER — LACTATED RINGERS IV SOLN
INTRAVENOUS | Status: DC
  Administered 2016-01-29 – 2016-01-31 (×4): via INTRAVENOUS

## 2016-01-29 MED ORDER — HYDRALAZINE HCL 20 MG/ML IJ SOLN: 10.0000 mg | Freq: Once | INTRAMUSCULAR | Status: DC | PRN

## 2016-01-29 MED ORDER — ACETAMINOPHEN 325 MG PO TABS
650.0000 mg | ORAL_TABLET | ORAL | Status: DC | PRN
  Administered 2016-01-31: 650 mg via ORAL
  Filled 2016-01-29: qty 2

## 2016-01-29 MED ORDER — VANCOMYCIN HCL IN DEXTROSE 1-5 GM/200ML-% IV SOLN
1000.0000 mg | Freq: Two times a day (BID) | INTRAVENOUS | Status: DC
  Administered 2016-01-30 – 2016-01-31 (×3): 1000 mg via INTRAVENOUS
  Filled 2016-01-29 (×5): qty 200

## 2016-01-29 MED ORDER — FLEET ENEMA 7-19 GM/118ML RE ENEM: 1.0000 | ENEMA | RECTAL | Status: DC | PRN

## 2016-01-29 MED ORDER — OXYTOCIN BOLUS FROM INFUSION: 500.0000 mL | INTRAVENOUS | Status: DC

## 2016-01-29 MED ORDER — ONDANSETRON HCL 4 MG/2ML IJ SOLN
4.0000 mg | Freq: Four times a day (QID) | INTRAMUSCULAR | Status: DC | PRN
  Administered 2016-01-31: 4 mg via INTRAVENOUS

## 2016-01-29 MED ORDER — LACTATED RINGERS IV SOLN
500.0000 mL | INTRAVENOUS | Status: DC | PRN
  Administered 2016-01-31 (×2): 1000 mL via INTRAVENOUS

## 2016-01-29 MED ORDER — OXYCODONE-ACETAMINOPHEN 5-325 MG PO TABS: 2.0000 | ORAL_TABLET | ORAL | Status: DC | PRN

## 2016-01-29 MED ORDER — OXYCODONE-ACETAMINOPHEN 5-325 MG PO TABS: 1.0000 | ORAL_TABLET | ORAL | Status: DC | PRN

## 2016-01-29 MED ORDER — LABETALOL HCL 5 MG/ML IV SOLN
20.0000 mg | INTRAVENOUS | Status: DC | PRN
  Administered 2016-01-30: 20 mg via INTRAVENOUS
  Filled 2016-01-29: qty 4

## 2016-01-29 MED ORDER — NALBUPHINE HCL 10 MG/ML IJ SOLN: 5.0000 mg | INTRAMUSCULAR | Status: DC | PRN

## 2016-01-29 MED ORDER — LIDOCAINE HCL (PF) 1 % IJ SOLN: 30.0000 mL | INTRAMUSCULAR | Status: DC | PRN

## 2016-01-29 MED ORDER — MISOPROSTOL 25 MCG QUARTER TABLET
25.0000 ug | ORAL_TABLET | ORAL | Status: DC | PRN
  Administered 2016-01-29: 25 ug via VAGINAL
  Filled 2016-01-29: qty 0.25

## 2016-01-29 MED ORDER — CITRIC ACID-SODIUM CITRATE 334-500 MG/5ML PO SOLN
30.0000 mL | ORAL | Status: DC | PRN
  Administered 2016-01-30 – 2016-01-31 (×2): 30 mL via ORAL
  Filled 2016-01-29 (×2): qty 15

## 2016-01-29 NOTE — Anesthesia Pain Management Evaluation Note (Signed)
  [facility] Pain Management Visit Note  Patient: Betty Perkins, 40 y.o., female  "Hello [facility] am a [facility]. We have an anesthesia team available at all times to provide care throughout the hospital, including epidural management and anesthesia for C-section. I don't know your plan for the delivery whether it a natural birth, water birth, IV sedation, nitrous supplementation, doula or epidural, but we want to meet your pain goals."   1.Was your pain managed to your expectations on prior hospitalizations?   Yes   2.What is your expectation for pain management during this hospitalization?     Epidural  3.How can we help you reach that goal? epidural  Record the patient's initial score and the patient's pain goal.   Pain: 0  Pain Goal: 7 [facility] wants you to be able to say your pain was always managed very well.  Cephus ShellingBURGER,Valery Amedee 01/29/2016

## 2016-01-29 NOTE — H&P (Signed)
Betty Perkins is a 40 y.o. female, G1P0000 at 40.0 weeks, presenting for induction of labor for gestational hypertension.  Patient Active Problem List   Diagnosis Date Noted  . Gestational hypertension 01/29/2016  . Left leg DVT (HCC) 11/24/2012  . Headache(784.0) 11/24/2012  . Acute pulmonary embolism (HCC) 11/23/2012    History of present pregnancy: Patient entered care at 6.2  weeks.    EDC of 01/30/16 was established by LMP on 04/25/15.    Anatomy scan:   20.1 weeks on 09/12/16 , with normal findings and an posterior placenta.     Singleton pregnancy. Transverse HML presentation. Cervix is closed- measured transabdominally 4.0cm,  posterior placenta, placental edge is 3.1cm, from internal os, fluid is normal CP, thal, renal arteries seen. EFW  393g, 60th%.   2V cord seen- right UA appears to be absent. RLQ cystic structure measuring 1.0x0.7x1.0cm,  unclear if originating from lower pole on right kidney vs ovarian cyst. Lat vents, post fossa, orbits, 4chamber,  heels seen- not optimally.   RVOT, 3VV, AA, DA, diaphragm, profile, NB, palate, 5th not well visualized due to f etal position, and maternal morbid obesity, techichally difficult exam, suboptimal images.  Additional [place] evaluations:    22 wks  F/u anatomy 09/26/15: SIUP at 22 wks, right pelvic cyst- most c/w ovarian cyst. All other detailed fetal anatomy  was seen and appeared normal, limited views of  the face, 4-Ch, DA, CI and spine, normal amniotic fluid  volume. Measurements consistent with LMP dating.  EFW  At the 65th%tile.   28 wks MFM F/u 11/07/15:   SIUP at 28 wks, normal interval anatomy, anatomic survey complete except for DA, pelvic  cyst not visualized today- resolved , normal amniotic volume, appropriative interval growth with EFW at the  55th%.    33.2 wks BPP 12/14/15:  Magdalen Spatz breech, posterior placenta, AFI 15.4cm, BPP 8/8 ,  EFW 50%tile,  33.6 wks BPP 12/18/15:  SIUP, vertex, posterior placenta,cervix measures  4.7cm. AFI is normal -35th%, BPP 8/8 in 15  min,. adnexas are unremarkable 35 wks BPP 12/26/15:   BPP 12/31/15:  SIUP, vertex, posterior placenta, AFI is normal -55th%, BPP 8/8 in 7 min, cervix not measured per protocol. adnexas are unremarkable  35.5 wks BPP 12/31/15:  SIUP, vertex, posterior placenta, AFI is normal -55th%, BPP 8/8 in 10 min, cervix not measured per protocol. adnexas are unremarkable 36.6 wks BPP 01/08/16:  SIUP, vertex, posterior placenta, AFI is normal -50th%, BPP 8/8 in 4 min, cervix not measured per  protocol. adnexas are unremarkable 38wks  BPP 01/16/16: Vertex pres. Posterior placenta . AFI wnl @ 55th tile. BPP 8/8 in 2 mins. Cx not meas. per  protocol. Ovaries and adnexas unremarkable. 39.0 wks obesity 01/23/16:  SIUP, vertex, posterior placenta, AFI is normal -65th%, BPP 8/8 in 3 min, cervix not  measured per protocol. adnexas are unremarkable 39.6 wks obesity 01/29/16: 150 BPM. Singleton preg. Vertex pres. Posterior placenta. AFI normal- 50th %tile. BPP 8/8 in  7 mins. Cx not meas. per protocol. Ovaries/adnexas unremarkable.  Significant prenatal events:  First Trimester:   Planned pregnancy. Desires Panorama Second trimester:  Yeast infection, Rx Clotrimazole x2 ; Hematology consult on 08/16/15. Early glucola abdnormal, 3 hr GTT normal.  Third Trimester:   On lovenox but switched to heparin in April; Breech presentation at 33 wks , vertex by 36 wks; Edema increased to 2+  Last evaluation:   39.6 wks  On 01/29/16 by A. Su Hilt, MD.  150 bpm,    0/30/-3 ;  142/98 and 150/90;   310 lbs  U/S then ROB Pt states her B/P has been elevated for 2-3 days Pt denies MAU Visit CM/CMA. FKCs and Labor Precautions. I discussed GHTN and rec for going to hospital this evening for induction. She denies HA, visual changes or abdominal pain. Pt agreeing to be induced. Last dose of heparin at 8am. PIH labs with PCR stat. Pt will go to hospital this evening  OB History    Gravida Para Term Preterm AB  TAB SAB Ectopic Multiple Living   1 0 0 0 0 0 0 0 0 0      Past Medical History  Diagnosis Date  . Pulmonary embolism (HCC)   . Superficial thrombophlebitis 12/2011  . DVT (deep venous thrombosis) [facility](HCC)    Past Surgical History  Procedure Laterality Date  . Ankle surgery      right ankle  . Foot surgery      left foot   Family History: family history includes Cancer in her maternal grandfather and paternal grandfather; Deep vein thrombosis in her brother, father, maternal aunt, mother, and other; Diabetes in her maternal grandmother; Hypertension in her father, maternal grandmother, and mother; Stroke in her maternal grandmother. Social History:  reports that she quit smoking about 9 years ago. Her smoking use included Cigarettes. She started smoking about 19 years ago. She has a 10 pack-year smoking history. She has never used smokeless tobacco. She reports that she does not drink alcohol or use illicit drugs.  Pt is caucasian with a 12 year education and some college.  She works as a Agricultural engineermanicurist.  She is heterosexual and  Married.   Prenatal Transfer Tool  Maternal Diabetes: Yes:  Diabetes Type:  Diet controlled Genetic Screening: Normal Maternal Ultrasounds/Referrals: Normal Fetal Ultrasounds or other Referrals:  None Maternal Substance Abuse:  No Significant Maternal Medications:  Meds include: Other:     Heparin 10,000 units BID Significant Maternal Lab Results: Lab values include: Group B Strep positive  TDAP 11/13/15 Flu 07/12/15  ROS:  All 10 systems are negative except where indicated in HPI;  No contractions, no lof, no vb, +FM  Allergies  Allergen Reactions  . Augmentin [Amoxicillin-Pot Clavulanate] Nausea And Vomiting    Dilation: Fingertip Effacement (%): Thick Station: -3 Exam by:: a.thacker rn Blood pressure 133/79, pulse 78, temperature 98.1 F (36.7 C), temperature source Oral, resp. rate 18, height 5\' 6"  (1.676 m), weight 141.069 kg (311 lb), last menstrual  period 04/25/2015.  Chest clear Heart RRR without murmur Abd gravid, NT, FH 40+ Pelvic: adequate Ext: wnl, pedal edema  FHR: Category 1, 130 bpm, moderate variability, +accels, no decels UCs:  rare  Prenatal labs: ABO, Rh: --/--/A POS, A POS (05/16 2100) Antibody: NEG (05/16 2100) Rubella:  !Error!     immune RPR: Nonreactive (09/23 0000)  HBsAg: Negative (09/23 0000)  HIV: Non-reactive (09/23 0000)  GBS: Positive (04/19 0000) Sickle cell/Hgb electrophoresis:  N/A Pap:  Wnl,  12/26/14 GC:  Negative 06/08/15 Chlamydia:  Negative   06/08/15 Genetic screenings:  Low risk, 08/16/15 Glucola:  Abnormal 1 hr GTT, Normal 3 hr GTT Other:   Hgb 13.6, Platelets 275, Uric 5.3, LDH 163, AST/ALT 13/15, PCR 117 Hgb 13.2 at NOB, 13.6 at 28 weeks   Assessment/Plan: IUP at 39.[redacted] wks Gestational Hypertension  Base line PIH labs Wnl,  PCR 117 Hx of Pulmonary Embolism x2 Cervix close/ Thick/high BMI 50  [facility]  per consult with Dr. Estanislado Pandy Routine [facility] orders Pain med/epidural prn Vancomycin for GBS prophylaxis  SCD's applied at all times while in bed R/B of induction methods: Cytotec, Foley Bulb, Pitocin, and foley bulb reviewed - pt expressed understanding and agreement Begin induction with Cytotec 25mg , per vagina  Beatrix Fetters, [place] 01/30/2016, 5:00 AM

## 2016-01-30 ENCOUNTER — Inpatient Hospital Stay (HOSPITAL_COMMUNITY): Payer: BLUE CROSS/BLUE SHIELD | Admitting: Anesthesiology

## 2016-01-30 DIAGNOSIS — Z6841 Body Mass Index (BMI) 40.0 and over, adult: Secondary | ICD-10-CM

## 2016-01-30 LAB — ABO/RH: ABO/RH(D): A POS

## 2016-01-30 LAB — CBC
HCT: 34.7 % — ABNORMAL LOW (ref 36.0–46.0)
HEMOGLOBIN: 12 g/dL (ref 12.0–15.0)
MCH: 32.2 pg (ref 26.0–34.0)
MCHC: 34.6 g/dL (ref 30.0–36.0)
MCV: 93 fL (ref 78.0–100.0)
Platelets: 203 10*3/uL (ref 150–400)
RBC: 3.73 MIL/uL — ABNORMAL LOW (ref 3.87–5.11)
RDW: 15.1 % (ref 11.5–15.5)
WBC: 10.4 10*3/uL (ref 4.0–10.5)

## 2016-01-30 LAB — PROTIME-INR
INR: 0.99 (ref 0.00–1.49)
PROTHROMBIN TIME: 13.3 s (ref 11.6–15.2)

## 2016-01-30 LAB — APTT: APTT: 28 s (ref 24–37)

## 2016-01-30 LAB — POCT FERN TEST: POCT FERN TEST: NEGATIVE

## 2016-01-30 MED ORDER — FENTANYL 2.5 MCG/ML BUPIVACAINE 1/10 % EPIDURAL INFUSION (WH - ANES)
14.0000 mL/h | INTRAMUSCULAR | Status: DC | PRN
  Administered 2016-01-30 – 2016-01-31 (×3): 14 mL/h via EPIDURAL
  Filled 2016-01-30 (×3): qty 125

## 2016-01-30 MED ORDER — LIDOCAINE HCL (PF) 1 % IJ SOLN
INTRAMUSCULAR | Status: DC | PRN
  Administered 2016-01-30 (×2): 5 mL

## 2016-01-30 MED ORDER — DIPHENHYDRAMINE HCL 50 MG/ML IJ SOLN: 12.5000 mg | INTRAMUSCULAR | Status: DC | PRN

## 2016-01-30 MED ORDER — FENTANYL CITRATE (PF) 100 MCG/2ML IJ SOLN
100.0000 ug | INTRAMUSCULAR | Status: DC | PRN
  Administered 2016-01-30: 100 ug via INTRAVENOUS
  Filled 2016-01-30: qty 2

## 2016-01-30 MED ORDER — EPHEDRINE 5 MG/ML INJ: 10.0000 mg | INTRAVENOUS | Status: DC | PRN

## 2016-01-30 MED ORDER — LACTATED RINGERS IV SOLN
500.0000 mL | Freq: Once | INTRAVENOUS | Status: AC
  Administered 2016-01-30: 500 mL via INTRAVENOUS

## 2016-01-30 MED ORDER — PHENYLEPHRINE 40 MCG/ML (10ML) SYRINGE FOR IV PUSH (FOR BLOOD PRESSURE SUPPORT): 80.0000 ug | PREFILLED_SYRINGE | INTRAVENOUS | Status: DC | PRN

## 2016-01-30 MED ORDER — PHENYLEPHRINE 40 MCG/ML (10ML) SYRINGE FOR IV PUSH (FOR BLOOD PRESSURE SUPPORT)
80.0000 ug | PREFILLED_SYRINGE | INTRAVENOUS | Status: DC | PRN
  Administered 2016-01-31: 80 ug via INTRAVENOUS
  Filled 2016-01-30 (×2): qty 10

## 2016-01-30 NOTE — Progress Notes (Signed)
Subjective: Comfortable with epidural.  Agreeable to AROM and  insertion of internal monitors.  Family, husband and pt's mother in room for labor support.   Objective: BP 126/65 mmHg  Pulse 82  Temp(Src) 98.1 F (36.7 C) (Oral)  Resp 18  Ht 5\' 6"  (1.676 m)  Wt 141.069 kg (311 lb)  BMI 50.22 kg/m2  SpO2 95%  LMP 04/25/2015      FHT: Category  1, 135 bpm, moderate variability, +accels, occasional early decels UC:   regular, every 2-3 minutes SVE:   Dilation: 4 Effacement (%): 70 Station: -2 Exam by:: R Dellie Piasecki, CNM  Membranes:  AROM at 2239 without difficulty, clear, moderate amount  Induction/Augmentation: Cytotec x1 dose, last at 2141on 01/29/16 Pitocin : 10 mu/min Internal monitors:  IUPC and FSE placed without difficulty  GBS prophylaxis: Vancomycin x1 dose, 1220 5/17  Pain management:  Fentanyl x1 dose @1015 , 5/17 Epidural - placed at 1952 , 5/17   Assessment:  IUP 40.0 IOL GHTN Inadequate contraction strength, Mvus 120-140 Morbid obesity, BMI 50 Hx Pulmonary Embolism, x2  Cat 1 FT   Plan: Encourage rest Frequent position changes to facilitate fetal descent and rotation Continue Pitocin induction per Protocol, Mvu goal 180-200  Continous use of SCD's while in bed  Continue all other management as ordered  Alphonzo Severanceachel Edgerrin Correia CNM, [place] 01/30/2016, 11:15 PM

## 2016-01-30 NOTE — Consult Note (Signed)
OK to restart sq bid heparin immediately after epidural removal per ASRA guidelines.  [employer]

## 2016-01-30 NOTE — Progress Notes (Signed)
Betty LatchJennifer L Perkins is a 40 y.o. G1P0000 at 5954w0d admitted for labor induction for gestational hypertension  Subjective: Patient feels comfortable.   Objective: BP 129/75 mmHg  Pulse 64  Temp(Src) 98 F (36.7 C) (Oral)  Resp 17  Ht 5\' 6"  (1.676 m)  Wt 141.069 kg (311 lb)  BMI 50.22 kg/m2  LMP 04/25/2015     FHT:  Category 1 tracing.   UC:   Irregular contractions.  CVX: 0/20%/-5.  Foley bulb placed at about 9 am, 40cc fluid in bulb.    Labs: Lab Results  Component Value Date   WBC 11.9* 01/29/2016   HGB 12.9 01/29/2016   HCT 37.5 01/29/2016   MCV 93.1 01/29/2016   PLT 251 01/29/2016    Assessment / Plan: Induction of labor due to gestational hypertension,  progressing well on pitocin, foley bulb now placed in  Labor: Foley bulb placed.  Allow patient to eat then resume pitocin with foley bulb.   Fetal Wellbeing:  Category I Pain Control:  None I/D:  n/a Anticipated MOD:  NSVD  Rajan Burgard WAKURU 01/30/2016, 11:05 AM

## 2016-01-30 NOTE — Anesthesia Procedure Notes (Signed)
Epidural Patient location during procedure: OB  Staffing Anesthesiologist: Deserai Cansler Performed by: anesthesiologist   Preanesthetic Checklist Completed: patient identified, site marked, surgical consent, pre-op evaluation, timeout performed, IV checked, risks and benefits discussed and monitors and equipment checked  Epidural Patient position: sitting Prep: DuraPrep Patient monitoring: heart rate, continuous pulse ox and blood pressure Approach: right paramedian Location: L4-L5 Injection technique: LOR saline  Needle:  Needle type: Tuohy  Needle gauge: 17 G Needle length: 9 cm and 9 Needle insertion depth: 7 cm Catheter type: closed end flexible Catheter size: 20 Guage Catheter at skin depth: 11 cm Test dose: negative  Assessment Events: blood not aspirated, injection not painful, no injection resistance, negative IV test and no paresthesia  Additional Notes Patient identified. Risks/Benefits/Options discussed with patient including but not limited to bleeding, infection, nerve damage, paralysis, failed block, incomplete pain control, headache, blood pressure changes, nausea, vomiting, reactions to medication both or allergic, itching and postpartum back pain. Confirmed with bedside nurse the patient's most recent platelet count. Confirmed with patient that they are not currently taking any anticoagulation, have any bleeding history or any family history of bleeding disorders. Patient expressed understanding and wished to proceed. All questions were answered. Sterile technique was used throughout the entire procedure. Please see nursing notes for vital signs. Test dose was given through epidural needle and negative prior to continuing to dose epidural or start infusion. Warning signs of high block given to the patient including shortness of breath, tingling/numbness in hands, complete motor block, or any concerning symptoms with instructions to call for help. Patient was given  instructions on fall risk and not to get out of bed. All questions and concerns addressed with instructions to call with any issues.   

## 2016-01-30 NOTE — Progress Notes (Signed)
Subjective: Comfortable with epidural,   Objective: BP 145/77 mmHg  Pulse 85  Temp(Src) 98.1 F (36.7 C) (Oral)  Resp 18  Ht 5\' 6"  (1.676 m)  Wt 141.069 kg (311 lb)  BMI 50.22 kg/m2  SpO2 95%  LMP 04/25/2015     FHT: Category 1, 140 bpm, moderate variability, +accels, no decels UC:   irregular, every 1-3 minutes ; difficult to trace/monitor due to maternal habitus and position SVE:   Dilation: 4 Effacement (%): 60 Station: -3 Exam by:: D Jasso, RN Membranes:  Intact Induction/Augmentation: Cytotec x1 dose, last at 2141on 01/29/16 Pitocin : 8 mu/min Internal monitors: none GBS prophylaxis: Vancomycin x1 dose, 1220 5/17  Pain management:   Fentanyl x1 dose @1015 ,  5/17  Epidural - placed at 1952 , 5/17  Assessment:  IUP 40.0 IOL GHTN Morbid obesity, BMI 50 Hx Pulmonary Embolism, x2  Cat 1 FT   Plan: Recheck cervix at 2230 -  Consider AROM and internal monitors , IUPC and FSE, if favorable to do so Frequent position changes to facilitate fetal descent and rotation Continue Pitocin induction per Protocol  Continous use of SCD's while in bed  Continue all other management as ordered  Alphonzo Severanceachel Brieann Osinski CNM, [place] 01/30/2016, 9:13 PM

## 2016-01-30 NOTE — Progress Notes (Signed)
Subjective: Comfortable, sitting in high fowlers eating italian ice. Husband at bedside for support   Objective: BP 125/80 mmHg  Pulse 80  Temp(Src) 98.1 F (36.7 C) (Oral)  Resp 18  Ht 5\' 6"  (1.676 m)  Wt 141.069 kg (311 lb)  BMI 50.22 kg/m2  LMP 04/25/2015      FHT: Category   1, 135 bpm, moderate variability,  UC:   irregular, every 2-5 minutes SVE:   Dilation: Fingertip Effacement (%): Thick Station: -3 Exam by:: a.thacker rn  membranesL intact Induction: S/p cytotecy x1 dose Pitocin at 896mu/min  Pain management: none GBS  Negative   Assessment:  IUP at 39.[redacted] wks Gestational Hypertension  Base line PIH labs Wnl, PCR 117 Hx of Pulmonary Embolism x2 Cervix close/ Thick/high BMI 50  Plan: Reassess cervical dilation Consider Foley Bulb placement if cervix is dilated to a min of 1 cm Begin GBS prophylaxis in active labor Continue Induction Continue all other management as ordered.  Report to on coming provider  Carmela HurtE. Kulwa MD   Betty Perkins CNM, [place] 01/30/2016, 6:13 AM

## 2016-01-30 NOTE — Progress Notes (Addendum)
  Betty LatchJennifer L Perkins is a 40 y.o. G1P0000 at [redacted]w[redacted]d admitted for labor induction for gestational hypertension  Subjective: Patient feels comfortable after fentanyl IV, feels contractions Q 2- 3 minutes.  Foley bulb fell out after about 4 hrs in.  Objective: BP 129/75 mmHg  Pulse 64  Temp(Src) 98 F (36.7 C) (Oral)  Resp 17  Ht 5\' 6"  (1.676 m)  Wt 141.069 kg (311 lb)  BMI 50.22 kg/m2  LMP 04/25/2015   Filed Vitals:   01/30/16 1344 01/30/16 1402 01/30/16 1432 01/30/16 1502  BP: 147/84 132/80 137/79 130/82  Pulse: 78 78 78 81  Temp: 98.2 F (36.8 C)     TempSrc: Oral     Resp: 14 15 16 17   Height:      Weight:         FHT:  130 BL mod var, reactive.   UC:   irregular, every 2- 5  minutes CVX: 4/60%/-5.   Attempted to AROM but patient uncomfortable with process, cervix very posterior. Membranes felt intact.    Labs: Lab Results  Component Value Date   WBC 11.9* 01/29/2016   HGB 12.9 01/29/2016   HCT 37.5 01/29/2016   MCV 93.1 01/29/2016   PLT 251 01/29/2016    Assessment / Plan: Induction of labor due to gestational hypertension,  progressing well on pitocin  Labor: Progressing on Pitocin, will continue to increase then AROM.  Plan to reattempt AROM and FSE/IUPC placement for better fetal monitoring once cervix comes down lower or patient with epidural.   Fetal Wellbeing:  Category I Pain Control:  IV pain meds I/D:  n/a Anticipated MOD:  NSVD

## 2016-01-30 NOTE — Progress Notes (Signed)
Subjective: Assumed care of patient.  In room to greet patient and her family.   Desires an epidural, getting prepped for epidural at this time.    Objective: BP 132/68 mmHg  Pulse 77  Temp(Src) 98.2 F (36.8 C) (Oral)  Resp 15  Ht  (1.676 m)  Wt 141.069 kg (311 lb)  BMI 50.22 kg/m2  LMP 04/25/2015      Filed Vitals:   01/30/16 1730 01/30/16 1802 01/30/16 1852 01/30/16 1902  BP: 139/78 148/81 145/86 132/68  Pulse: 70 76 75 77  Temp:      TempSrc:      Resp: Height:      Weight:       Results for orders placed or performed during the hospital encounter of 01/29/16 (from the past 24 hour(s))  CBC     Status: Abnormal   Collection Time: 01/29/16  9:00 PM  Result Value Ref Range   WBC 11.9 (H) 4.0 - 10.5 K/uL   RBC 4.03 3.87 - 5.11 MIL/uL   Hemoglobin 12.9 12.0 - 15.0 g/dL   HCT 69.6 29.5 - 28.4 %   MCV 93.1 78.0 - 100.0 fL   MCH 32.0 26.0 - 34.0 pg   MCHC 34.4 30.0 - 36.0 g/dL   RDW 13.2 44.0 - 10.2 %   Platelets 251 150 - 400 K/uL  Type and screen [facility]     Status: None   Collection Time: 01/29/16  9:00 PM  Result Value Ref Range   ABO/RH(D) A POS    Antibody Screen NEG    Sample Expiration 02/01/2016   ABO/Rh     Status: None   Collection Time: 01/29/16  9:00 PM  Result Value Ref Range   ABO/RH(D) A POS   Fern Test     Status: Normal   Collection Time: 01/30/16  9:05 AM  Result Value Ref Range   POCT Fern Test Negative = intact amniotic membranes   CBC     Status: Abnormal   Collection Time: 01/30/16 12:44 PM  Result Value Ref Range   WBC 10.4 4.0 - 10.5 K/uL   RBC 3.73 (L) 3.87 - 5.11 MIL/uL   Hemoglobin 12.0 12.0 - 15.0 g/dL   HCT 72.5 (L) 36.6 - 44.0 %   MCV 93.0 78.0 - 100.0 fL   MCH 32.2 26.0 - 34.0 pg   MCHC 34.6 30.0 - 36.0 g/dL   RDW 34.7 42.5 - 95.6 %   Platelets 203 150 - 400 K/uL  APTT     Status: None   Collection Time: 01/30/16 12:44 PM  Result Value Ref Range   aPTT 28 24 - 37 seconds   Protime-INR     Status: None   Collection Time: 01/30/16 12:44 PM  Result Value Ref Range   Prothrombin Time 13.3 11.6 - 15.2 seconds   INR 0.99 0.00 - 1.49    FHT: Category 1, 140 bpm, moderate variability, +accels, no decels UC:   regular, every 2-3 minutes SVE:   Dilation: 4 Effacement (%): 60 Station: -3 Exam by:: Sallye Ober, MD Membranes:  Induction/Augmentation:  Cytotec x1 dose, last at 2141on 01/29/16  Pitocin :   8 mu/min  Internal monitors:  none GBS prophylaxis: Vancomycin x1 dose, 1220 5/17  Pain management: Fentanyl x1 dose  5/17  Assessment:  IUP 40.0 IOL GHTN Morbid obesity, BMI 50 Hx Pulmonary Embolism, x2   Plan: Epidural placement  After Epidural, Recheck cervix after pt  is comfortable Once comfortable, Consider AROM and internal monitors , IUPC and FSE, if favorable to do so Frequent position changes to facilitate fetal descent and rotation Continue Pitocin induction per Protocol  Continous use of SCD's while in bed  Continue all other management as ordered  Alphonzo Severanceachel Dayson Aboud CNM, [place] 01/30/2016, 7:39 PM

## 2016-01-30 NOTE — Anesthesia Preprocedure Evaluation (Signed)
Anesthesia Evaluation  Patient identified by MRN, date of birth, ID band Patient awake    Reviewed: Allergy & Precautions, H&P , NPO status , Patient's Chart, lab work & pertinent test results  History of Anesthesia Complications Negative for: history of anesthetic complications  Airway Mallampati: II  TM Distance: >3 FB Neck ROM: full    Dental no notable dental hx. (+) Teeth Intact   Pulmonary neg pulmonary ROS, former smoker,    Pulmonary exam normal breath sounds clear to auscultation       Cardiovascular hypertension, + DVT (with PE)  Normal cardiovascular exam Rhythm:regular Rate:Normal     Neuro/Psych negative neurological ROS  negative psych ROS   GI/Hepatic negative GI ROS, Neg liver ROS,   Endo/Other  negative endocrine ROSMorbid obesity  Renal/GU negative Renal ROS  negative genitourinary   Musculoskeletal   Abdominal   Peds  Hematology negative hematology ROS (+)   Anesthesia Other Findings G/o dvt with PE. Was on lovenox, now heparin. Last dose yesterday. Labs good.  Reproductive/Obstetrics (+) Pregnancy                             Anesthesia Physical Anesthesia Plan  ASA: III  Anesthesia Plan: Epidural   Post-op Pain Management:    Induction:   Airway Management Planned:   Additional Equipment:   Intra-op Plan:   Post-operative Plan:   Informed Consent: I have reviewed the patients History and Physical, chart, labs and discussed the procedure including the risks, benefits and alternatives for the proposed anesthesia with the patient or authorized representative who has indicated his/her understanding and acceptance.     Plan Discussed with:   Anesthesia Plan Comments:         Anesthesia Quick Evaluation

## 2016-01-31 ENCOUNTER — Encounter (HOSPITAL_COMMUNITY): Payer: Self-pay | Admitting: *Deleted

## 2016-01-31 ENCOUNTER — Encounter (HOSPITAL_COMMUNITY)
Admission: AD | Disposition: A | Discharge status: Home / Self Care | Payer: Self-pay | Source: Ambulatory Visit | Attending: Obstetrics and Gynecology

## 2016-01-31 LAB — RPR: RPR Ser Ql: NONREACTIVE

## 2016-01-31 SURGERY — Surgical Case
Anesthesia: Regional

## 2016-01-31 MED ORDER — COCONUT OIL OIL: 1.0000 "application " | TOPICAL_OIL | Status: DC | PRN

## 2016-01-31 MED ORDER — FENTANYL CITRATE (PF) 100 MCG/2ML IJ SOLN: 25.0000 ug | INTRAMUSCULAR | Status: DC | PRN

## 2016-01-31 MED ORDER — ACETAMINOPHEN 325 MG PO TABS: 650.0000 mg | ORAL_TABLET | ORAL | Status: DC | PRN

## 2016-01-31 MED ORDER — NALBUPHINE HCL 10 MG/ML IJ SOLN: 5.0000 mg | INTRAMUSCULAR | Status: DC | PRN

## 2016-01-31 MED ORDER — DIBUCAINE 1 % RE OINT: 1.0000 "application " | TOPICAL_OINTMENT | RECTAL | Status: DC | PRN

## 2016-01-31 MED ORDER — MENTHOL 3 MG MT LOZG: 1.0000 | LOZENGE | OROMUCOSAL | Status: DC | PRN

## 2016-01-31 MED ORDER — ONDANSETRON HCL 4 MG/2ML IJ SOLN
INTRAMUSCULAR | Status: AC
  Filled 2016-01-31: qty 2

## 2016-01-31 MED ORDER — CHLOROPROCAINE HCL (PF) 3 % IJ SOLN
INTRAMUSCULAR | Status: AC
  Filled 2016-01-31: qty 20

## 2016-01-31 MED ORDER — PHENYLEPHRINE HCL 10 MG/ML IJ SOLN
INTRAMUSCULAR | Status: DC | PRN
  Administered 2016-01-31 (×2): 80 ug via INTRAVENOUS

## 2016-01-31 MED ORDER — CEFAZOLIN SODIUM-DEXTROSE 2-4 GM/100ML-% IV SOLN
INTRAVENOUS | Status: AC
  Filled 2016-01-31: qty 100

## 2016-01-31 MED ORDER — KETOROLAC TROMETHAMINE 30 MG/ML IJ SOLN
30.0000 mg | Freq: Four times a day (QID) | INTRAMUSCULAR | Status: AC | PRN
  Administered 2016-01-31: 30 mg via INTRAMUSCULAR

## 2016-01-31 MED ORDER — LIDOCAINE-EPINEPHRINE (PF) 2 %-1:200000 IJ SOLN
INTRAMUSCULAR | Status: AC
  Filled 2016-01-31: qty 20

## 2016-01-31 MED ORDER — SIMETHICONE 80 MG PO CHEW
80.0000 mg | CHEWABLE_TABLET | ORAL | Status: DC
  Administered 2016-02-01 – 2016-02-02 (×2): 80 mg via ORAL
  Filled 2016-01-31 (×2): qty 1

## 2016-01-31 MED ORDER — NALBUPHINE HCL 10 MG/ML IJ SOLN: 5.0000 mg | Freq: Once | INTRAMUSCULAR | Status: DC | PRN

## 2016-01-31 MED ORDER — MORPHINE SULFATE (PF) 0.5 MG/ML IJ SOLN
INTRAMUSCULAR | Status: AC
  Filled 2016-01-31: qty 10

## 2016-01-31 MED ORDER — OXYTOCIN 40 UNITS IN LACTATED RINGERS INFUSION - SIMPLE MED: 2.5000 [IU]/h | INTRAVENOUS | Status: AC

## 2016-01-31 MED ORDER — SODIUM BICARBONATE 8.4 % IV SOLN
INTRAVENOUS | Status: AC
  Filled 2016-01-31: qty 50

## 2016-01-31 MED ORDER — LACTATED RINGERS IV SOLN
INTRAVENOUS | Status: DC
  Administered 2016-01-31: via INTRAVENOUS

## 2016-01-31 MED ORDER — SODIUM CHLORIDE 0.9 % IR SOLN
Status: DC | PRN
  Administered 2016-01-31: 1

## 2016-01-31 MED ORDER — SIMETHICONE 80 MG PO CHEW
80.0000 mg | CHEWABLE_TABLET | Freq: Three times a day (TID) | ORAL | Status: DC
  Administered 2016-02-01 – 2016-02-02 (×4): 80 mg via ORAL
  Filled 2016-01-31 (×5): qty 1

## 2016-01-31 MED ORDER — FLEET ENEMA 7-19 GM/118ML RE ENEM: 1.0000 | ENEMA | Freq: Every day | RECTAL | Status: DC | PRN

## 2016-01-31 MED ORDER — MEASLES, MUMPS & RUBELLA VAC ~~LOC~~ INJ: 0.5000 mL | INJECTION | Freq: Once | SUBCUTANEOUS | Status: DC

## 2016-01-31 MED ORDER — ACETAMINOPHEN 500 MG PO TABS
1000.0000 mg | ORAL_TABLET | Freq: Four times a day (QID) | ORAL | Status: AC
  Administered 2016-02-01 (×3): 1000 mg via ORAL
  Filled 2016-01-31 (×4): qty 2

## 2016-01-31 MED ORDER — IBUPROFEN 600 MG PO TABS
600.0000 mg | ORAL_TABLET | Freq: Four times a day (QID) | ORAL | Status: DC
  Administered 2016-02-01 – 2016-02-02 (×7): 600 mg via ORAL
  Filled 2016-01-31 (×7): qty 1

## 2016-01-31 MED ORDER — NALOXONE HCL 2 MG/2ML IJ SOSY: 1.0000 ug/kg/h | PREFILLED_SYRINGE | INTRAMUSCULAR | Status: DC | PRN

## 2016-01-31 MED ORDER — OXYTOCIN 10 UNIT/ML IJ SOLN
40.0000 [IU] | INTRAVENOUS | Status: DC | PRN
  Administered 2016-01-31: 40 [IU] via INTRAVENOUS

## 2016-01-31 MED ORDER — LACTATED RINGERS IV SOLN
INTRAVENOUS | Status: DC | PRN
  Administered 2016-01-31 (×2): via INTRAVENOUS

## 2016-01-31 MED ORDER — KETOROLAC TROMETHAMINE 30 MG/ML IJ SOLN
INTRAMUSCULAR | Status: AC
  Filled 2016-01-31: qty 1

## 2016-01-31 MED ORDER — DIPHENHYDRAMINE HCL 50 MG/ML IJ SOLN: 12.5000 mg | INTRAMUSCULAR | Status: DC | PRN

## 2016-01-31 MED ORDER — NALOXONE HCL 0.4 MG/ML IJ SOLN: 0.4000 mg | INTRAMUSCULAR | Status: DC | PRN

## 2016-01-31 MED ORDER — ZOLPIDEM TARTRATE 5 MG PO TABS: 5.0000 mg | ORAL_TABLET | Freq: Every evening | ORAL | Status: DC | PRN

## 2016-01-31 MED ORDER — ONDANSETRON HCL 4 MG/2ML IJ SOLN: 4.0000 mg | Freq: Three times a day (TID) | INTRAMUSCULAR | Status: DC | PRN

## 2016-01-31 MED ORDER — BISACODYL 10 MG RE SUPP: 10.0000 mg | Freq: Every day | RECTAL | Status: DC | PRN

## 2016-01-31 MED ORDER — WITCH HAZEL-GLYCERIN EX PADS: 1.0000 "application " | MEDICATED_PAD | CUTANEOUS | Status: DC | PRN

## 2016-01-31 MED ORDER — SODIUM CHLORIDE 0.9% FLUSH: 3.0000 mL | INTRAVENOUS | Status: DC | PRN

## 2016-01-31 MED ORDER — ONDANSETRON HCL 4 MG/2ML IJ SOLN: 4.0000 mg | Freq: Once | INTRAMUSCULAR | Status: DC | PRN

## 2016-01-31 MED ORDER — SIMETHICONE 80 MG PO CHEW
80.0000 mg | CHEWABLE_TABLET | ORAL | Status: DC | PRN
  Administered 2016-02-02: 80 mg via ORAL

## 2016-01-31 MED ORDER — TETANUS-DIPHTH-ACELL PERTUSSIS 5-2.5-18.5 LF-MCG/0.5 IM SUSP: 0.5000 mL | Freq: Once | INTRAMUSCULAR | Status: DC

## 2016-01-31 MED ORDER — SCOPOLAMINE 1 MG/3DAYS TD PT72: 1.0000 | MEDICATED_PATCH | Freq: Once | TRANSDERMAL | Status: DC

## 2016-01-31 MED ORDER — FERROUS SULFATE 325 (65 FE) MG PO TABS
325.0000 mg | ORAL_TABLET | Freq: Two times a day (BID) | ORAL | Status: DC
  Administered 2016-02-01 – 2016-02-02 (×3): 325 mg via ORAL
  Filled 2016-01-31 (×3): qty 1

## 2016-01-31 MED ORDER — MEPERIDINE HCL 25 MG/ML IJ SOLN: 6.2500 mg | INTRAMUSCULAR | Status: DC | PRN

## 2016-01-31 MED ORDER — DIPHENHYDRAMINE HCL 25 MG PO CAPS: 25.0000 mg | ORAL_CAPSULE | Freq: Four times a day (QID) | ORAL | Status: DC | PRN

## 2016-01-31 MED ORDER — FLUMAZENIL 0.5 MG/5ML IV SOLN
INTRAVENOUS | Status: AC
  Filled 2016-01-31: qty 5

## 2016-01-31 MED ORDER — MORPHINE SULFATE (PF) 0.5 MG/ML IJ SOLN
INTRAMUSCULAR | Status: DC | PRN
Start: 2016-01-31 — End: 2016-01-31
  Administered 2016-01-31: 4 mg via EPIDURAL
  Administered 2016-01-31: 1 mg via EPIDURAL

## 2016-01-31 MED ORDER — PRENATAL MULTIVITAMIN CH
1.0000 | ORAL_TABLET | Freq: Every day | ORAL | Status: DC
  Administered 2016-02-01 – 2016-02-02 (×2): 1 via ORAL
  Filled 2016-01-31 (×2): qty 1

## 2016-01-31 MED ORDER — KETOROLAC TROMETHAMINE 30 MG/ML IJ SOLN: 30.0000 mg | Freq: Four times a day (QID) | INTRAMUSCULAR | Status: AC | PRN

## 2016-01-31 MED ORDER — SODIUM BICARBONATE 8.4 % IV SOLN
INTRAVENOUS | Status: DC | PRN
  Administered 2016-01-31 (×3): 5 mL via EPIDURAL

## 2016-01-31 MED ORDER — DIPHENHYDRAMINE HCL 25 MG PO CAPS: 25.0000 mg | ORAL_CAPSULE | ORAL | Status: DC | PRN

## 2016-01-31 MED ORDER — SENNOSIDES-DOCUSATE SODIUM 8.6-50 MG PO TABS
2.0000 | ORAL_TABLET | ORAL | Status: DC
  Administered 2016-02-01 – 2016-02-02 (×2): 2 via ORAL
  Filled 2016-01-31 (×2): qty 2

## 2016-01-31 MED ORDER — PHENYLEPHRINE 40 MCG/ML (10ML) SYRINGE FOR IV PUSH (FOR BLOOD PRESSURE SUPPORT)
PREFILLED_SYRINGE | INTRAVENOUS | Status: AC
  Filled 2016-01-31: qty 10

## 2016-01-31 MED ORDER — LIDOCAINE HCL (CARDIAC) 20 MG/ML IV SOLN
INTRAVENOUS | Status: AC
  Filled 2016-01-31: qty 5

## 2016-01-31 MED ORDER — OXYTOCIN 10 UNIT/ML IJ SOLN
INTRAMUSCULAR | Status: AC
  Filled 2016-01-31: qty 4

## 2016-01-31 MED ORDER — DEXTROSE 5 % IV SOLN
3.0000 g | Freq: Once | INTRAVENOUS | Status: AC
  Administered 2016-01-31: 3 g via INTRAVENOUS
  Filled 2016-01-31: qty 3000

## 2016-01-31 SURGICAL SUPPLY — 46 items
BENZOIN TINCTURE PRP APPL 2/3 (GAUZE/BANDAGES/DRESSINGS) ×2 IMPLANT
CLAMP CORD UMBIL (MISCELLANEOUS) IMPLANT
CLOSURE STERI STRIP 1/2 X4 (GAUZE/BANDAGES/DRESSINGS) ×4 IMPLANT
CLOTH BEACON ORANGE TIMEOUT ST (SAFETY) ×2 IMPLANT
CONTAINER PREFILL 10% NBF 15ML (MISCELLANEOUS) IMPLANT
DRAIN JACKSON PRT FLT 10 (DRAIN) ×2 IMPLANT
DRSG OPSITE POSTOP 4X10 (GAUZE/BANDAGES/DRESSINGS) ×2 IMPLANT
DRSG OPSITE POSTOP 4X12 (GAUZE/BANDAGES/DRESSINGS) ×2 IMPLANT
DURAPREP 26ML APPLICATOR (WOUND CARE) ×2 IMPLANT
ELECT REM PT RETURN 9FT ADLT (ELECTROSURGICAL) ×2
ELECTRODE REM PT RTRN 9FT ADLT (ELECTROSURGICAL) ×1 IMPLANT
EVACUATOR SILICONE 100CC (DRAIN) ×2 IMPLANT
EXTRACTOR VACUUM M CUP 4 TUBE (SUCTIONS) IMPLANT
GLOVE BIO SURGEON STRL SZ 6.5 (GLOVE) ×2 IMPLANT
GLOVE BIOGEL PI IND STRL 7.0 (GLOVE) ×2 IMPLANT
GLOVE BIOGEL PI INDICATOR 7.0 (GLOVE) ×2
GOWN STRL REUS W/TWL LRG LVL3 (GOWN DISPOSABLE) ×4 IMPLANT
HEMOSTAT SURGICEL 2X3 (HEMOSTASIS) ×2 IMPLANT
KIT ABG SYR 3ML LUER SLIP (SYRINGE) IMPLANT
NEEDLE HYPO 25X5/8 SAFETYGLIDE (NEEDLE) IMPLANT
NS IRRIG 1000ML POUR BTL (IV SOLUTION) ×2 IMPLANT
PACK C SECTION WH (CUSTOM PROCEDURE TRAY) ×2 IMPLANT
PAD ABD 7.5X8 STRL (GAUZE/BANDAGES/DRESSINGS) ×4 IMPLANT
PAD OB MATERNITY 4.3X12.25 (PERSONAL CARE ITEMS) ×2 IMPLANT
PENCIL SMOKE EVAC W/HOLSTER (ELECTROSURGICAL) ×2 IMPLANT
RETRACTOR TRAXI PANNICULUS (MISCELLANEOUS) ×1 IMPLANT
RTRCTR C-SECT PINK 25CM LRG (MISCELLANEOUS) IMPLANT
RTRCTR C-SECT PINK 34CM XLRG (MISCELLANEOUS) ×2 IMPLANT
SPONGE DRAIN TRACH 4X4 STRL 2S (GAUZE/BANDAGES/DRESSINGS) ×2 IMPLANT
SPONGE GAUZE 4X4 12PLY STER LF (GAUZE/BANDAGES/DRESSINGS) ×4 IMPLANT
SPONGE LAP 18X18 X RAY DECT (DISPOSABLE) ×2 IMPLANT
STRIP CLOSURE SKIN 1/2X4 (GAUZE/BANDAGES/DRESSINGS) ×2 IMPLANT
SUT CHROMIC 0 CT 1 (SUTURE) ×2 IMPLANT
SUT MNCRL AB 3-0 PS2 27 (SUTURE) ×2 IMPLANT
SUT PLAIN 2 0 (SUTURE) ×2
SUT PLAIN 2 0 XLH (SUTURE) ×2 IMPLANT
SUT PLAIN ABS 2-0 CT1 27XMFL (SUTURE) ×2 IMPLANT
SUT SILK 2 0 SH (SUTURE) IMPLANT
SUT SILK 3 0 SH 30 (SUTURE) ×2 IMPLANT
SUT VIC AB 0 CTX 36 (SUTURE) ×4
SUT VIC AB 0 CTX36XBRD ANBCTRL (SUTURE) ×4 IMPLANT
SUT VIC AB 2-0 SH 27 (SUTURE) ×2
SUT VIC AB 2-0 SH 27XBRD (SUTURE) ×2 IMPLANT
TOWEL OR 17X24 6PK STRL BLUE (TOWEL DISPOSABLE) ×2 IMPLANT
TRAXI PANNICULUS RETRACTOR (MISCELLANEOUS) ×1
TRAY FOLEY CATH SILVER 14FR (SET/KITS/TRAYS/PACK) ×2 IMPLANT

## 2016-01-31 NOTE — Progress Notes (Signed)
Betty Perkins is a 40 y.o. G1P0000 at 7035w1d by LMP [facility]  Subjective: Pt without c/o  Objective: BP 139/90 mmHg  Pulse 86  Temp(Src) 98.8 F (37.1 C) (Oral)  Resp 16  Ht 5\' 6"  (1.676 m)  Wt 311 lb (141.069 kg)  BMI 50.22 kg/m2  SpO2 95%  LMP 04/25/2015   Total I/O In: -  Out: 800 [Urine:800]  FHT:  FHR: 135-140 bpm, variability: moderate,  accelerations:  Present,  decelerations:  Present variables with good recovery UC:   regular, every 2-3 minutes SVE:   Dilation: 4 Effacement (%): 80 Station: -2 Exam by:: Dr Betty Perkins  Labs: Lab Results  Component Value Date   WBC 10.4 01/30/2016   HGB 12.0 01/30/2016   HCT 34.7* 01/30/2016   MCV 93.0 01/30/2016   PLT 203 01/30/2016    Assessment / Plan: Induction of labor due to gestational hypertension,  progressing well on pitocin  Labor: contractions are inadequate.  continue pitocin until adequate or at a maximum dose Preeclampsia:  no signs or symptoms of toxicity Fetal Wellbeing:  Category II Pain Control:  Epidural I/D:  n/a Anticipated MOD:  NSVD  Betty Perkins A 01/31/2016, 10:51 AM

## 2016-01-31 NOTE — Transfer of Care (Signed)
Immediate Anesthesia Transfer of Care Note  Patient: Betty Perkins  Procedure(s) Performed: Procedure(s): CESAREAN SECTION (N/A)  Patient Location: PACU  Anesthesia Type:Spinal  Level of Consciousness: awake  Airway & Oxygen Therapy: Patient Spontanous Breathing  Post-op Assessment: Report given to RN  Post vital signs: Reviewed and stable  Last Vitals:  Filed Vitals:   01/31/16 1506 01/31/16 1513  BP: 129/75 99/45  Pulse: 74 80  Temp:    Resp: 16 16    Last Pain:  Filed Vitals:   01/31/16 1514  PainSc: 0-No pain      Patients Stated Pain Goal: 7 (01/30/16 1852)  Complications: No apparent anesthesia complications

## 2016-01-31 NOTE — Progress Notes (Addendum)
Subjective: Sleeping, easy to waken. Family sleeping bedside.   Objective: BP 132/74 mmHg  Pulse 65  Temp(Src) 98.2 F (36.8 C) (Oral)  Resp 18  Ht 5\' 6"  (1.676 m)  Wt 141.069 kg (311 lb)  BMI 50.22 kg/m2  SpO2 95%  LMP 04/25/2015     Filed Vitals:   01/30/16 2332 01/31/16 0002 01/31/16 0032 01/31/16 0231  BP: 125/66 122/70 132/74 110/79  Pulse: 68 64 65 107  Temp:  98.2 F (36.8 C)  98.3 F (36.8 C)  TempSrc:  Oral  Oral  Resp:    16  Height:      Weight:      SpO2:         FHT:  Overall Cat 1- periods of Category 2, 135 bpm,min- moderate variability, no accels, occasional variables UC:   regular, every 2-3 minutes SVE:   Dilation: 4.5 Effacement (%): 80 Station: -2 Exam by:: R Kason Benak, CNM Membranes:  AROM at 2239 , clear, moderate amount  Induction/Augmentation: Cytotec x1 dose, last at 2141on 01/29/16 Pitocin : 12 mu/min Internal monitors: IUPC. FSE  GBS prophylaxis: Vancomycin x 2 dose, last at 0020, 5/18  Pain management:  Fentanyl x1 dose @1015 , 5/17 Epidural - placed at 1952 , 5/17  Assessment:  IUP 40.0 IOL GHTN AROM x4 hrs Inadequate contraction strength, Mvus 140-150 Morbid obesity, BMI 50 Hx Pulmonary Embolism, x2  Overall Cat 1 with periods of Cat 2 FT   Plan: -Encourage rest -Intrauterine resuscitation methods PRN  Including but not limited to- IV bolus, maternal position change, oxygen, stop pitocin, -Frequent position changes to facilitate fetal descent and rotation -Continue Pitocin induction per Protocol, Mvu goal 180-200  -Continous use of SCD's while in bed  -Continue all other management as ordered   Betty Perkins CNM, [place] 01/31/2016, 2:36 AM

## 2016-01-31 NOTE — Op Note (Signed)
Cesarean Section Procedure Note   Betty Perkins  01/29/2016 - 01/31/2016  Indications: FAILED INDUCTION  Fetal decels and paratubal cyst  Pre-operative Diagnosis: failed induction, gestational hypertension.   Post-operative Diagnosis: Same   Surgeon: Surgeon(s) and Role:    * Jaymes GraffNaima Salene Mohamud, MD - Primary   Assistants: Eliott NineRACEY TURNER FNA   Anesthesia: epidural   Procedure Details:  The patient was seen in the Holding Room. The risks, benefits, complications, treatment options, and expected outcomes were discussed with the patient. The patient concurred with the proposed plan, giving informed consent. identified as Betty LatchJennifer L Depp and the procedure verified as C-Section Delivery. A Time Out was held and the above information confirmed.  After induction of anesthesia, the patient was draped and prepped in the usual sterile manner. A transverse incision was made and carried down through the subcutaneous tissue to the fascia. Fascial incision was made in the midline and extended transversely. The fascia was separated from the underlying rectus muscle superiorly and inferiorly. The peritoneum was identified and entered. Peritoneal incision was extended longitudinally with good visualization of bowel and bladder. The utero-vesical peritoneal reflection was incised transversely and the bladder flap was bluntly freed from the lower uterine segment.  An alexsis retractor was placed in the abdomen.   A low transverse uterine incision was made. Delivered from cephalic presentation was a  infant, with Apgar scores of 8 at one minute and 8 at five minutes. Cord ph was 7.39 unable to run arterial the umbilical cord was clamped and cut cord blood was obtained for evaluation. The placenta was removed Intact and appeared normal. The uterine outline, tubes and ovaries appeared normal}. The uterine incision was closed with running locked sutures of 0Vicryl. A second layer 0 vicrlyl was used to imbricate the  uterine incision    Hemostasis was observed. Lavage was carried out until clear. The alexsis was removed.  The peritoneum was closed with 0 chromic.  The muscles were examined and any bleeders were made hemostatic using bovie cautery device.   The fascia was then reapproximated with running sutures of 0 vicryl.  The subcutaneous tissue was reapproximated  With interrupted stitches using 2-0 plain gut. The subcuticular closure was performed using 3-590monocryl. A small paratubal cyst was removed with the metezenbaum scissors before closing the peritoneum     Instrument, sponge, and needle counts were correct prior the abdominal closure and were correct at the conclusion of the case.    Findings: infant was delivered from vtx presentation. The fluid was meconium stained.  The uterus tubes and ovaries appeared normal.     Estimated Blood Loss: 800ml   Total IV Fluids: 2000ml   Urine Output: 100CC OF rose colored urine  Specimens: placenta to pathology  Complications: no complications  Disposition: PACU - hemodynamically stable.   Maternal Condition: stable   Baby condition / location:  Couplet care / Skin to Skin  Attending Attestation: I performed the procedure.   Signed: Surgeon(s): Jaymes GraffNaima Jaxsyn Catalfamo, MD

## 2016-01-31 NOTE — Progress Notes (Signed)
Patient ID: Betty Perkins, female   DOB: [birth year], 40 y.o.   MRN: [id number]  Pt without c/o BP 143/68 mmHg  Pulse 91  Temp(Src) 98.2 F (36.8 C) (Oral)  Resp 18  Ht 5\' 6"  (1.676 m)  Wt 311 lb (141.069 kg)  BMI 50.22 kg/m2  SpO2 95%  LMP 04/25/2015 fhts 160 with accels. occ variable toco q 2minutes not adequate cx 4/80/-1 with caput Pt on 30 miliunits of pitocin.  FHTS has occ decel and she is inadequate Failed induction  PIH Recommend CS.  R&B reviewed in detail

## 2016-01-31 NOTE — Progress Notes (Signed)
Subjective: Resting, comfortable   Objective: BP 110/79 mmHg  Pulse 107  Temp(Src) 98.3 F (36.8 C) (Oral)  Resp 16  Ht 5\' 6"  (1.676 m)  Wt 141.069 kg (311 lb)  BMI 50.22 kg/m2  SpO2 95%  LMP 04/25/2015      FHT: Category 1, 135 bpm, moderate variability, +accels, no decels  (0430-0500) UC:   regular, every 2 minutes SVE:   Dilation: 6 Effacement (%): 80 Station: -2 Exam by:: R Jahziel Sinn, CNM Membranes:  AROM at 2239 , clear, moderate amount  Induction/Augmentation: Cytotec x1 dose, last at 2141on 01/29/16 Pitocin : 14 mu/min Internal monitors: IUPC. FSE  GBS prophylaxis: Vancomycin x 2 dose, last at 0020, 5/18  Pain management:  Fentanyl x1 dose @1015 , 5/17 Epidural - placed at 1952 , 5/17  Assessment:  IUP 40.0 IOL GHTN AROM x 6.5 hrs Inadequate contraction strength, Mvus 140-150 Morbid obesity, BMI 50 Hx Pulmonary Embolism, x2  Overall Cat 1 with periods of Cat 2 FT   Plan: Encourage rest -Intrauterine resuscitation methods PRN Including but not limited to- IV bolus, maternal position change, oxygen, stop pitocin, -Frequent position changes to facilitate fetal descent and rotation -Continue Pitocin induction per Protocol, Mvu goal 180-200  -Continous use of SCD's while in bed  -Continue all other management as ordered - Report to oncoming provider N.Normand Sloopillard, MD  Alphonzo Severanceachel Marykay Mccleod CNM, [place] 01/31/2016, 5:02 AM

## 2016-01-31 NOTE — Progress Notes (Signed)
POC for C/S initiated.  Consent obtained per Dr Normand Sloopillard.

## 2016-01-31 NOTE — Anesthesia Postprocedure Evaluation (Signed)
Anesthesia Post Note  Patient: Betty Perkins  Procedure(s) Performed: Procedure(s) (LRB): CESAREAN SECTION (N/A)  Patient location during evaluation: PACU Anesthesia Type: Epidural Level of consciousness: awake and alert Pain management: pain level controlled Vital Signs Assessment: post-procedure vital signs reviewed and stable Respiratory status: spontaneous breathing, nonlabored ventilation, respiratory function stable and patient connected to nasal cannula oxygen Cardiovascular status: blood pressure returned to baseline and stable Postop Assessment: no signs of nausea or vomiting, patient able to bend at knees and epidural receding Anesthetic complications: no     Last Vitals:  Filed Vitals:   01/31/16 1506 01/31/16 1513  BP: 129/75 99/45  Pulse: 74 80  Temp:    Resp: 16 16    Last Pain:  Filed Vitals:   01/31/16 1718  PainSc: 0-No pain   Pain Goal: Patients Stated Pain Goal: 7 (01/30/16 1852)               [name]

## 2016-01-31 NOTE — Lactation Note (Signed)
This note was copied from a baby's chart. Lactation Consultation Note  Patient Name: Betty Perkins ZOXWR'UToday's Date: 01/31/2016 Reason for consult: Initial assessment Baby at 6 hr of life and mom is having trouble latching baby. Mom has pendulous, compressible, flat breast with a short shaft nipple that is almost on the underside of the breast. Baby has a noticeable  lingual frenulum with an insertion point mid tongue. Baby has a high palate with 2 side by side, anterior, hard, white lumps. Baby bites down on a gloved finger. She will extend tongue to gum line but does not maintain tongue position for more than 5-6 sucks before pulling tongue in and biting down. Baby has ok lateralization of tongue, but does not lift tongue past midline. Applied #20 NS and baby was able to stay on the breast for short bursts of sucking. Discussed baby behavior, feeding frequency, baby belly size, voids, wt loss, breast changes, and nipple care. Demonstrated manual expression, colostrum noted bilaterally, spoon in room. Given lactation handouts and [facility]. Aware of OP services and support group.        Maternal Data Has patient been taught Hand Expression?: Yes Does the patient have breastfeeding experience prior to this delivery?: No  Feeding Feeding Type: Breast Fed Length of feed: 20 min  LATCH Score/Interventions Latch: Repeated attempts needed to sustain latch, nipple held in mouth throughout feeding, stimulation needed to elicit sucking reflex. Intervention(s): Adjust position;Assist with latch;Breast compression  Audible Swallowing: A few with stimulation Intervention(s): Hand expression;Skin to skin Intervention(s): Alternate breast massage  Type of Nipple: Everted at rest and after stimulation  Comfort (Breast/Nipple): Filling, red/small blisters or bruises, mild/mod discomfort  Problem noted: Mild/Moderate discomfort;Cracked, bleeding, blisters, bruises Interventions   (Cracked/bleeding/bruising/blister): Expressed breast milk to nipple Interventions (Mild/moderate discomfort): Comfort gels  Hold (Positioning): Full assist, staff holds infant at breast Intervention(s): Support Pillows;Position options  LATCH Score: 5  Lactation Tools Discussed/Used WIC Program: No Pump Review: Setup, frequency, and cleaning;Milk Storage Initiated by:: ES Date initiated:: 01/31/16   Consult Status Consult Status: Follow-up Date: 02/01/16 Follow-up type: In-patient    [name] 01/31/2016, 10:24 PM

## 2016-01-31 NOTE — Progress Notes (Signed)
Subjective: Resting  Objective: BP 126/60 mmHg  Pulse 94  Temp(Src) 98.3 F (36.8 C) (Oral)  Resp 18  Ht 5\' 6"  (1.676 m)  Wt 141.069 kg (311 lb)  BMI 50.22 kg/m2  SpO2 95%  LMP 04/25/2015      FHT: Category  2, 145 bpm, min-moderate variability with short burst of marked variability, +accels, variable decelerations UC:   regular, every 1.5-2 minutes SVE:   Dilation: 5 Effacement (%): 80 Station: -2 Exam by:: R Denvil Canning, CNM Membranes:  AROM at 2239 , clear, moderate amount  Induction/Augmentation: Cytotec x1 dose, last at 2141on 01/29/16 Pitocin : 14 mu/min Internal monitors: IUPC. FSE  GBS prophylaxis: Vancomycin x 2 dose, last at 0020, 5/18  Pain management:  Fentanyl x1 dose @1015 , 5/17 Epidural - placed at 1952 , 5/17  Assessment:  IUP 40.0 IOL GHTN AROM x 8.5 hrs  Mvus 180-200  Morbid obesity, BMI 50 Hx Pulmonary Embolism, x2  Cat 2 FT   Plan: Consult Dr. Dion BodyVarnado -Reduce Pitocin to 6910mu/min -Maternal IV bolus, maternal position change, Continue Intrauterine resuscitation methods PRN  -Frequent position changes to facilitate fetal descent and rotation -Continous use of SCD's while in bed  -Continue all other management as ordered - Report to oncoming provider N.Normand Sloopillard, MD  Betty Perkins CNM, [place] 01/31/2016, 6:49 AM

## 2016-02-01 ENCOUNTER — Encounter (HOSPITAL_COMMUNITY): Payer: Self-pay | Admitting: *Deleted

## 2016-02-01 DIAGNOSIS — Z98891 History of uterine scar from previous surgery: Secondary | ICD-10-CM

## 2016-02-01 LAB — COMPREHENSIVE METABOLIC PANEL
ALK PHOS: 123 U/L (ref 38–126)
ALT: 17 U/L (ref 14–54)
ALT: 17 U/L (ref 14–54)
AST: 21 U/L (ref 15–41)
AST: 22 U/L (ref 15–41)
Albumin: 2 g/dL — ABNORMAL LOW (ref 3.5–5.0)
Albumin: 1.9 g/dL — ABNORMAL LOW (ref 3.5–5.0)
Alkaline Phosphatase: 125 U/L (ref 38–126)
Anion gap: 9 (ref 5–15)
Anion gap: 8 (ref 5–15)
BILIRUBIN TOTAL: 0.5 mg/dL (ref 0.3–1.2)
BILIRUBIN TOTAL: 0.4 mg/dL (ref 0.3–1.2)
BUN: 14 mg/dL (ref 6–20)
BUN: 16 mg/dL (ref 6–20)
CHLORIDE: 105 mmol/L (ref 101–111)
CO2: 24 mmol/L (ref 22–32)
CO2: 23 mmol/L (ref 22–32)
CREATININE: 1.05 mg/dL — AB (ref 0.44–1.00)
CREATININE: 0.97 mg/dL (ref 0.44–1.00)
Calcium: 8.7 mg/dL — ABNORMAL LOW (ref 8.9–10.3)
Calcium: 8.7 mg/dL — ABNORMAL LOW (ref 8.9–10.3)
Chloride: 103 mmol/L (ref 101–111)
GLUCOSE: 83 mg/dL (ref 65–99)
Glucose, Bld: 101 mg/dL — ABNORMAL HIGH (ref 65–99)
Potassium: 4 mmol/L (ref 3.5–5.1)
Potassium: 3.8 mmol/L (ref 3.5–5.1)
Sodium: 138 mmol/L (ref 135–145)
Sodium: 134 mmol/L — ABNORMAL LOW (ref 135–145)
TOTAL PROTEIN: 4.8 g/dL — AB (ref 6.5–8.1)
Total Protein: 5.1 g/dL — ABNORMAL LOW (ref 6.5–8.1)

## 2016-02-01 LAB — CBC
HEMATOCRIT: 29.9 % — AB (ref 36.0–46.0)
Hemoglobin: 10 g/dL — ABNORMAL LOW (ref 12.0–15.0)
MCH: 31.3 pg (ref 26.0–34.0)
MCHC: 33.4 g/dL (ref 30.0–36.0)
MCV: 93.7 fL (ref 78.0–100.0)
Platelets: 207 10*3/uL (ref 150–400)
RBC: 3.19 MIL/uL — ABNORMAL LOW (ref 3.87–5.11)
RDW: 15.2 % (ref 11.5–15.5)
WBC: 13.8 10*3/uL — AB (ref 4.0–10.5)

## 2016-02-01 LAB — URIC ACID: Uric Acid, Serum: 6.4 mg/dL (ref 2.3–6.6)

## 2016-02-01 LAB — LACTATE DEHYDROGENASE: LDH: 177 U/L (ref 98–192)

## 2016-02-01 MED ORDER — SODIUM CHLORIDE 0.9 % IV SOLN: 250.0000 mL | INTRAVENOUS | Status: DC | PRN

## 2016-02-01 MED ORDER — SODIUM CHLORIDE 0.9% FLUSH: 3.0000 mL | INTRAVENOUS | Status: DC | PRN

## 2016-02-01 MED ORDER — SODIUM CHLORIDE 0.9% FLUSH
3.0000 mL | Freq: Two times a day (BID) | INTRAVENOUS | Status: DC
  Administered 2016-02-01: 3 mL via INTRAVENOUS

## 2016-02-01 MED ORDER — LACTATED RINGERS IV BOLUS (SEPSIS)
500.0000 mL | Freq: Once | INTRAVENOUS | Status: AC
  Administered 2016-02-01: 500 mL via INTRAVENOUS

## 2016-02-01 MED ORDER — HEPARIN SODIUM (PORCINE) 10000 UNIT/ML IJ SOLN
10000.0000 [IU] | Freq: Two times a day (BID) | INTRAMUSCULAR | Status: DC
  Administered 2016-02-01 – 2016-02-02 (×2): 10000 [IU] via SUBCUTANEOUS
  Filled 2016-02-01 (×3): qty 1

## 2016-02-01 NOTE — Anesthesia Postprocedure Evaluation (Signed)
Anesthesia Post Note  Patient: Betty LatchJennifer L Perkins  Procedure(s) Performed: Procedure(s) (LRB): CESAREAN SECTION (N/A)  Patient location during evaluation: Mother Baby Anesthesia Type: Epidural Level of consciousness: oriented and awake and alert Pain management: pain level controlled Vital Signs Assessment: post-procedure vital signs reviewed and stable Respiratory status: spontaneous breathing and nonlabored ventilation Cardiovascular status: stable Postop Assessment: epidural receding, patient able to bend at knees, no signs of nausea or vomiting and adequate PO intake Anesthetic complications: no     Last Vitals:  Filed Vitals:   02/01/16 0715 02/01/16 0845  BP:    Pulse:  64  Temp:    Resp: 15 22    Last Pain:  Filed Vitals:   02/01/16 0853  PainSc: 0-No pain   Pain Goal: Patients Stated Pain Goal: 7 (01/30/16 1852)               Laban EmperorMalinova,Brihana Quickel Hristova

## 2016-02-01 NOTE — Addendum Note (Signed)
Addendum  created 02/01/16 0906 by Elgie CongoNataliya H Lassie Demorest, CRNA   Modules edited: Charges VN, Clinical Notes   Clinical Notes:  File: [id number]

## 2016-02-01 NOTE — Progress Notes (Addendum)
Subjective: Postpartum Day 1: Cesarean Delivery GHTN without pre-eclampsia, no anti-hypertensive Patient reports that pain is well-managed.Lochia normal. Denies symptoms of pre-eclampsia tolerating diet as ordered without difficulty. Normal flatus. Absent bowel movement.  Objective: Vital signs in last 24 hours: Temp:  [97.5 F (36.4 C)-98.8 F (37.1 C)] 97.7 F (36.5 C) (05/19 0551) Pulse Rate:  [58-100] 58 (05/19 0905) Resp:  [14-30] 22 (05/19 0845) BP: (87-144)/(45-90) 112/56 mmHg (05/19 0551) SpO2:  [94 %-100 %] 97 % (05/19 0845)   I/O have been borderline all night. Received LR 500 cc bolus at 8:00 with 75 cc urine  Physical Exam:  General: alert Lochia: appropriate Uterine Fundus: firm and appropriately tender Incision: healing well DVT Evaluation: No evidence of DVT seen on physical exam. Edema 2+   Recent Labs  01/30/16 1244 02/01/16 0523  HGB 12.0 10.0*  HCT 34.7* 29.9*   CMP normal except for creatinine up to 1.05 this morning  Assessment/Plan: Status post Cesarean section. Doing well postoperatively. Borderline urine output: repeat LR bolus and follow closely. repeat CMP at noon   Anticipate discharge 02/03/16  Jakerria Kingbird A 02/01/2016, 9:35 AM   Urine output picked up after 2nd bolus of LR: 425 cc in the last 2 hours Creatinine is trending down and back to normal at 0.97 OK to D/C Foley but maintain strict I?O Heparin to be restarted at 4:00 pm today

## 2016-02-02 ENCOUNTER — Encounter (HOSPITAL_COMMUNITY): Payer: Self-pay | Admitting: *Deleted

## 2016-02-02 LAB — COMPREHENSIVE METABOLIC PANEL
ALBUMIN: 2.2 g/dL — AB (ref 3.5–5.0)
ALK PHOS: 123 U/L (ref 38–126)
ALT: 19 U/L (ref 14–54)
ANION GAP: 10 (ref 5–15)
AST: 25 U/L (ref 15–41)
BUN: 12 mg/dL (ref 6–20)
CO2: 24 mmol/L (ref 22–32)
Calcium: 9.3 mg/dL (ref 8.9–10.3)
Chloride: 105 mmol/L (ref 101–111)
Creatinine, Ser: 0.74 mg/dL (ref 0.44–1.00)
GFR calc Af Amer: >60 mL/min (ref >60)
GFR calc non Af Amer: >60 mL/min (ref >60)
GLUCOSE: 84 mg/dL (ref 65–99)
POTASSIUM: 4.2 mmol/L (ref 3.5–5.1)
SODIUM: 139 mmol/L (ref 135–145)
Total Bilirubin: 0.4 mg/dL (ref 0.3–1.2)
Total Protein: 5.7 g/dL — ABNORMAL LOW (ref 6.5–8.1)

## 2016-02-02 LAB — URIC ACID: Uric Acid, Serum: 6.1 mg/dL (ref 2.3–6.6)

## 2016-02-02 LAB — LACTATE DEHYDROGENASE: LDH: 180 U/L (ref 98–192)

## 2016-02-02 MED ORDER — OXYCODONE-ACETAMINOPHEN 5-325 MG PO TABS: 1.0000 | ORAL_TABLET | ORAL | Status: DC | PRN

## 2016-02-02 MED ORDER — OXYCODONE-ACETAMINOPHEN 5-325 MG PO TABS
1.0000 | ORAL_TABLET | ORAL | Status: DC | PRN
  Administered 2016-02-02: 1 via ORAL
  Filled 2016-02-02: qty 1

## 2016-02-02 MED ORDER — IBUPROFEN 800 MG PO TABS: 800.0000 mg | ORAL_TABLET | Freq: Three times a day (TID) | ORAL | Status: DC | PRN

## 2016-02-02 NOTE — Discharge Instructions (Signed)
Postpartum Depression and Baby Blues °The postpartum period begins right after the birth of a baby. During this time, there is often a great amount of joy and excitement. It is also a time of many changes in the life of the parents. Regardless of how many times a mother gives birth, each child brings new challenges and dynamics to the family. It is not unusual to have feelings of excitement along with confusing shifts in moods, emotions, and thoughts. All mothers are at risk of developing postpartum depression or the "baby blues." These mood changes can occur right after giving birth, or they may occur many months after giving birth. The baby blues or postpartum depression can be mild or severe. Additionally, postpartum depression can go away rather quickly, or it can be a long-term condition.  °CAUSES °Raised hormone levels and the rapid drop in those levels are thought to be a main cause of postpartum depression and the baby blues. A number of hormones change during and after pregnancy. Estrogen and progesterone usually decrease right after the delivery of your baby. The levels of thyroid hormone and various cortisol steroids also rapidly drop. Other factors that play a role in these mood changes include major life events and genetics.  °RISK FACTORS °If you have any of the following risks for the baby blues or postpartum depression, know what symptoms to watch out for during the postpartum period. Risk factors that may increase the likelihood of getting the baby blues or postpartum depression include: °· Having a personal or family history of depression.   °· Having depression while being pregnant.   °· Having premenstrual mood issues or mood issues related to oral contraceptives. °· Having a lot of life stress.   °· Having marital conflict.   °· Lacking a social support network.   °· Having a baby with special needs.   °· Having health problems, such as diabetes.   °SIGNS AND SYMPTOMS °Symptoms of baby blues  include: °· Brief changes in mood, such as going from extreme happiness to sadness. °· Decreased concentration.   °· Difficulty sleeping.   °· Crying spells, tearfulness.   °· Irritability.   °· Anxiety.   °Symptoms of postpartum depression typically begin within the first month after giving birth. These symptoms include: °· Difficulty sleeping or excessive sleepiness.   °· Marked weight loss.   °· Agitation.   °· Feelings of worthlessness.   °· Lack of interest in activity or food.   °Postpartum psychosis is a very serious condition and can be dangerous. Fortunately, it is rare. Displaying any of the following symptoms is cause for immediate medical attention. Symptoms of postpartum psychosis include:  °· Hallucinations and delusions.   °· Bizarre or disorganized behavior.   °· Confusion or disorientation.   °DIAGNOSIS  °A diagnosis is made by an evaluation of your symptoms. There are no medical or lab tests that lead to a diagnosis, but there are various questionnaires that a health care provider may use to identify those with the baby blues, postpartum depression, or psychosis. Often, a screening tool called the Edinburgh Postnatal Depression Scale is used to diagnose depression in the postpartum period.  °TREATMENT °The baby blues usually goes away on its own in 1-2 weeks. Social support is often all that is needed. You will be encouraged to get adequate sleep and rest. Occasionally, you may be given medicines to help you sleep.  °Postpartum depression requires treatment because it can last several months or longer if it is not treated. Treatment may include individual or group therapy, medicine, or both to address any social, physiological, and psychological   factors that may play a role in the depression. Regular exercise, a healthy diet, rest, and social support may also be strongly recommended.  °Postpartum psychosis is more serious and needs treatment right away. Hospitalization is often needed. °HOME CARE  INSTRUCTIONS °· Get as much rest as you can. Nap when the baby sleeps.   °· Exercise regularly. Some women find yoga and walking to be beneficial.   °· Eat a balanced and nourishing diet.   °· Do little things that you enjoy. Have a cup of tea, take a bubble bath, read your favorite magazine, or listen to your favorite music. °· Avoid alcohol.   °· Ask for help with household chores, cooking, grocery shopping, or running errands as needed. Do not try to do everything.   °· Talk to people close to you about how you are feeling. Get support from your partner, family members, friends, or other new moms. °· Try to stay positive in how you think. Think about the things you are grateful for.   °· Do not spend a lot of time alone.   °· Only take over-the-counter or prescription medicine as directed by your health care provider. °· Keep all your postpartum appointments.   °· Let your health care provider know if you have any concerns.   °SEEK MEDICAL CARE IF: °You are having a reaction to or problems with your medicine. °SEEK IMMEDIATE MEDICAL CARE IF: °· You have suicidal feelings.   °· You think you may harm the baby or someone else. °MAKE SURE YOU: °· Understand these instructions. °· Will watch your condition. °· Will get help right away if you are not doing well or get worse. °  °This information is not intended to replace advice given to you by your health care provider. Make sure you discuss any questions you have with your health care provider. °  °Document Released: 06/05/2004 Document Revised: 09/06/2013 Document Reviewed: 06/13/2013 °Elsevier Interactive Patient Education ©2016 [employer] °Postpartum Care After Cesarean Delivery °After you deliver your newborn (postpartum period), the usual stay in the hospital is 24-72 hours. If there were problems with your labor or delivery, or if you have other medical problems, you might be in the hospital longer.  °While you are in the hospital, you will receive help and  instructions on how to care for yourself and your newborn during the postpartum period.  °While you are in the hospital: °· It is normal for you to have pain or discomfort from the incision in your abdomen. Be sure to tell your nurses when you are having pain, where the pain is located, and what makes the pain worse. °· If you are breastfeeding, you may feel uncomfortable contractions of your uterus for a couple of weeks. This is normal. The contractions help your uterus get back to normal size. °· It is normal to have some bleeding after delivery. °· For the first 1-3 days after delivery, the flow is red and the amount may be similar to a period. °· It is common for the flow to start and stop. °· In the first few days, you may pass some small clots. Let your nurses know if you begin to pass large clots or your flow increases. °· Do not  flush blood clots down the toilet before having the nurse look at them. °· During the next 3-10 days after delivery, your flow should become more watery and pink or brown-tinged in color. °· Ten to fourteen days after delivery, your flow should be a small amount of yellowish-white discharge. °·   The amount of your flow will decrease over the first few weeks after delivery. Your flow may stop in 6-8 weeks. Most women have had their flow stop by 12 weeks after delivery. °· You should change your sanitary pads frequently. °· Wash your hands thoroughly with soap and water for at least 20 seconds after changing pads, using the toilet, or before holding or feeding your newborn. °· Your intravenous (IV) tubing will be removed when you are drinking enough fluids. °· The urine drainage tube (urinary catheter) that was inserted before delivery may be removed within 6-8 hours after delivery or when feeling returns to your legs. You should feel like you need to empty your bladder within the first 6-8 hours after the catheter has been removed. °· In case you become weak, lightheaded, or faint,  call your nurse before you get out of bed for the first time and before you take a shower for the first time. °· Within the first few days after delivery, your breasts may begin to feel tender and full. This is called engorgement. Breast tenderness usually goes away within 48-72 hours after engorgement occurs. You may also notice milk leaking from your breasts. If you are not breastfeeding, do not stimulate your breasts. Breast stimulation can make your breasts produce more milk. °· Spending as much time as possible with your newborn is very important. During this time, you and your newborn can feel close and get to know each other. Having your newborn stay in your room (rooming in) will help to strengthen the bond with your newborn. It will give you time to get to know your newborn and become comfortable caring for your newborn. °· Your hormones change after delivery. Sometimes the hormone changes can temporarily cause you to feel sad or tearful. These feelings should not last more than a few days. If these feelings last longer than that, you should talk to your caregiver. °· If desired, talk to your caregiver about methods of family planning or contraception. °· Talk to your caregiver about immunizations. Your caregiver may want you to have the following immunizations before leaving the hospital: °· Tetanus, diphtheria, and pertussis (Tdap) or tetanus and diphtheria (Td) immunization. It is very important that you and your family (including grandparents) or others caring for your newborn are up-to-date with the Tdap or Td immunizations. The Tdap or Td immunization can help protect your newborn from getting ill. °· Rubella immunization. °· Varicella (chickenpox) immunization. °· Influenza immunization. You should receive this annual immunization if you did not receive the immunization during your pregnancy. °  °This information is not intended to replace advice given to you by your health care provider. Make sure  you discuss any questions you have with your health care provider. °  °Document Released: 05/26/2012 Document Reviewed: 05/26/2012 °Elsevier Interactive Patient Education ©2016 [employer] ° °

## 2016-02-02 NOTE — Discharge Summary (Signed)
[facility] Dis[facility] Summary   Patient Name:   Betty Perkins DOB:     10-22-1975 MRN:     [id number]  Date of Admission:   01/29/2016 Date of Discharge:  02/02/2016  Admitting diagnosis:    Induction 39.6w Principal Problem:   Status post primary low transverse cesarean section--on heparin Active Problems:   Gestational hypertension   Morbid obesity with BMI of 50.0-59.9, adult (HCC) History of pulmonary embolism Positive beta strep    Discharge diagnosis:    Induction 39.6w Principal Problem:   Status post primary low transverse cesarean section--on heparin Active Problems:   Gestational hypertension   Morbid obesity with BMI of 50.0-59.9, adult (HCC) History of pulmonary embolism Positive beta strep Failure to progress in labor Fetal indication Anemia                                                                     Post partum procedures: None  Type of Delivery:  Primary low transverse cesarean section  Delivering Provider: Jaymes GraffILLARD, NAIMA   Date of Delivery:  01/31/2016  Newborn Data:    Live born female  Birth Weight: 7 lb 5.6 oz (3335 g) APGAR: 8, 9  Baby's Name:  Betty Perkins Baby Feeding:   Bottle and Breast Disposition:   home with mother  Complications:   None  Hospital course:      Induction of Labor With Cesarean Section  40 y.o. yo G1P1001 at 8928w1d was admitted to the hospital 01/29/2016 for induction of labor. Patient had a labor course significant for a prolonged attempt to induce vaginal delivery. The patient received Cytotec, Pitocin, and a Foley bulb. She was given vancomycin because of her beta strep positive test.. The patient went for cesarean section due to Arrest of Dilation, and delivered a Viable infant. Apgar scores were at 1 minute and at 5 minutes. Membrane Rupture Time/Date: )10:39 PM ,01/30/2016 . Details of operation can be found in separate operative Note.  The patient received heparin postpartum because of her  history of a deep venous thrombosis on 2 occasions. She did well postpartum. Patient had an uncomplicated postpartum course. She is ambulating, tolerating a regular diet, passing flatus, and urinating well.  Patient is discharged home in stable condition on 02/02/2016. She has a JP drain that we will remove in the office in one week.                                  Physical Exam:   Filed Vitals:   02/01/16 1451 02/01/16 1600 02/01/16 1800 02/02/16 0542  BP:  105/53 134/88 133/94  Pulse: 78 69 85 83  Temp:  98.5 F (36.9 C) 98 F (36.7 C) 98.2 F (36.8 C)  TempSrc:  Oral  Oral  Resp: 18 18 19 18   Height:      Weight:      SpO2: 98% 98% 98%    General: no distress Lochia: appropriate Uterine Fundus: firm Incision: Dressing is clean, dry, and intact DVT Evaluation: No evidence of DVT seen on physical exam.  Labs: Lab Results  Component Value Date   WBC 13.8* 02/01/2016   HGB 10.0* 02/01/2016  HCT 29.9* 02/01/2016   MCV 93.7 02/01/2016   PLT 207 02/01/2016   CMP Latest Ref Rng 02/02/2016  Glucose 65 - 99 mg/dL 84  BUN 6 - 20 mg/dL 12  Creatinine 0.10 - 2.72 mg/dL 5.36  Sodium 644 - 034 mmol/L 139  Potassium 3.5 - 5.1 mmol/L 4.2  Chloride 101 - 111 mmol/L 105  CO2 22 - 32 mmol/L 24  Calcium 8.9 - 10.3 mg/dL 9.3  Total Protein 6.5 - 8.1 g/dL 7.4(Q)  Total Bilirubin 0.3 - 1.2 mg/dL 0.4  Alkaline Phos 38 - 126 U/L 123  AST 15 - 41 U/L 25  ALT 14 - 54 U/L 19    Discharge instruction: per After Visit Summary and "Baby and Me Booklet".  After Visit Meds:    Medication List    TAKE these medications        acetaminophen 500 MG tablet  Commonly known as:  TYLENOL  Take 1,000 mg by mouth every 6 (six) hours as needed for pain.     Heparin Sodium (Porcine) 59563 UNIT/10ML Sosy  Inject 10,000 Units into the skin 2 (two) times daily.     ibuprofen 800 MG tablet  Commonly known as:  ADVIL,MOTRIN  Take 1 tablet (800 mg total) by mouth every 8 (eight) hours as needed.      multivitamin-prenatal 27-0.8 MG Tabs tablet  Take 1 tablet by mouth daily at 12 noon.     oxyCODONE-acetaminophen 5-325 MG tablet  Commonly known as:  ROXICET  Take 1 tablet by mouth every 4 (four) hours as needed for severe pain.        Diet: routine diet  Activity: Advance as tolerated. Pelvic rest for 6 weeks.   Outpatient follow up: 1 week Follow up Appt:Future Appointments Date Time Provider Department Center  03/24/2016 3:00 PM [facility] None  03/24/2016 3:30 PM Josph Macho, MD [facility] None   Follow up visit: 1 week.  Postpartum contraception: Undecided  02/02/2016 Janine Limbo, MD

## 2016-02-02 NOTE — Lactation Note (Signed)
This note was copied from a baby's chart. Lactation Consultation Note  Patient Name: Betty Perkins   Visited with Mom on day of discharge, baby 6443 hrs old.  Mom choosing to pump and bottle feed only.  Mom has an [employer]meda Purely Yours at home.  Reviewed importance of pumping >8 hrs in 24 hrs, 15-20 minutes.  Recommended skin to skin, and breast massage and manual breast expression along with pumping.  Engorgement prevention and treatment discussed.  Reminded Mom of OP lactation services available to her.  To call prn.   Judee ClaraSmith, Raequan Vanschaick E Perkins, 11:47 AM

## 2016-02-04 ENCOUNTER — Encounter (HOSPITAL_COMMUNITY): Payer: Self-pay | Admitting: Obstetrics and Gynecology

## 2016-03-24 ENCOUNTER — Other Ambulatory Visit: Payer: BLUE CROSS/BLUE SHIELD

## 2016-03-24 ENCOUNTER — Ambulatory Visit: Payer: BLUE CROSS/BLUE SHIELD | Admitting: Hematology & Oncology

## 2016-04-04 ENCOUNTER — Ambulatory Visit (HOSPITAL_BASED_OUTPATIENT_CLINIC_OR_DEPARTMENT_OTHER): Payer: BLUE CROSS/BLUE SHIELD | Admitting: Hematology & Oncology

## 2016-04-04 ENCOUNTER — Encounter: Payer: Self-pay | Admitting: Hematology & Oncology

## 2016-04-04 ENCOUNTER — Other Ambulatory Visit (HOSPITAL_BASED_OUTPATIENT_CLINIC_OR_DEPARTMENT_OTHER): Payer: BLUE CROSS/BLUE SHIELD

## 2016-04-04 VITALS — BP 160/82 | HR 72 | Temp 97.4°F | Resp 16 | Ht 66.0 in | Wt 288.0 lb

## 2016-04-04 DIAGNOSIS — Z86711 Personal history of pulmonary embolism: Secondary | ICD-10-CM | POA: Diagnosis not present

## 2016-04-04 DIAGNOSIS — Z86718 Personal history of other venous thrombosis and embolism: Secondary | ICD-10-CM | POA: Diagnosis not present

## 2016-04-04 DIAGNOSIS — I82432 Acute embolism and thrombosis of left popliteal vein: Secondary | ICD-10-CM

## 2016-04-04 DIAGNOSIS — Z3493 Encounter for supervision of normal pregnancy, unspecified, third trimester: Secondary | ICD-10-CM

## 2016-04-04 DIAGNOSIS — I2699 Other pulmonary embolism without acute cor pulmonale: Secondary | ICD-10-CM

## 2016-04-04 DIAGNOSIS — O133 Gestational [pregnancy-induced] hypertension without significant proteinuria, third trimester: Secondary | ICD-10-CM

## 2016-04-04 LAB — CBC WITH DIFFERENTIAL (CANCER CENTER ONLY)
BASO#: 0.1 10*3/uL (ref 0.0–0.2)
BASO%: 0.6 % (ref 0.0–2.0)
EOS%: 2.1 % (ref 0.0–7.0)
Eosinophils Absolute: 0.2 10*3/uL (ref 0.0–0.5)
HCT: 36.4 % (ref 34.8–46.6)
HGB: 12.3 g/dL (ref 11.6–15.9)
LYMPH#: 2 10*3/uL (ref 0.9–3.3)
LYMPH%: 19.5 % (ref 14.0–48.0)
MCH: 31.4 pg (ref 26.0–34.0)
MCHC: 33.8 g/dL (ref 32.0–36.0)
MCV: 93 fL (ref 81–101)
MONO#: 0.6 10*3/uL (ref 0.1–0.9)
MONO%: 6.4 % (ref 0.0–13.0)
NEUT#: 7.2 10*3/uL — ABNORMAL HIGH (ref 1.5–6.5)
NEUT%: 71.4 % (ref 39.6–80.0)
Platelets: 360 10*3/uL (ref 145–400)
RBC: 3.92 10*6/uL (ref 3.70–5.32)
RDW: 12.8 % (ref 11.1–15.7)
WBC: 10.1 10*3/uL — ABNORMAL HIGH (ref 3.9–10.0)

## 2016-04-04 LAB — PROTIME-INR (CHCC SATELLITE)
INR: 1 — ABNORMAL LOW (ref 2.0–3.5)
PROTIME: 12 s (ref 10.6–13.4)

## 2016-04-04 NOTE — Progress Notes (Addendum)
Hematology and Oncology Follow Up Visit  Betty LatchJennifer L Perkins [id number] [birth year] 40 y.o. 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 is back for follow-up. She had her baby  on May 18. Everything turned out well. She did need a C-section. This was for failed induction. There is no problems with respect to bleeding. Everything turned out well. Her baby was healthy. Her baby's name is Betty Perkins  She continued on the Lovenox after delivery. She was on it for 6 weeks. She now is off it. She stopped it 3 days ago.  She's had no problems with leg swelling. Has been no cough. She's had no shortness of breath. She's had no nausea or vomiting.  There's been no bleeding.  Overall, her performance status is ECOG 0.  Medications:  Current outpatient prescriptions:  .  acetaminophen (TYLENOL) 500 MG tablet, Take 1,000 mg by mouth every 6 (six) hours as needed for pain., Disp: , Rfl:  .  Heparin Sodium, Porcine, [id number] UNIT/10ML SOSY, Inject 10,000 Units into the skin 2 (two) times daily., Disp: 60 Syringe, Rfl: 1 .  ibuprofen (ADVIL,MOTRIN) 800 MG tablet, Take 1 tablet (800 mg total) by mouth every 8 (eight) hours as needed., Disp: 50 tablet, Rfl: 1 .  oxyCODONE-acetaminophen (ROXICET) 5-325 MG tablet, Take 1 tablet by mouth every 4 (four) hours as needed for severe pain., Disp: 15 tablet, Rfl: 0 .  Prenatal Vit-Fe Fumarate-FA (MULTIVITAMIN-PRENATAL) 27-0.8 MG TABS tablet, Take 1 tablet by mouth daily at 12 noon., Disp: , Rfl:   Allergies:  Allergies  Allergen Reactions  . Augmentin [Amoxicillin-Pot Clavulanate] Nausea And Vomiting    Past Medical History, Surgical history, Social history, and Family History were reviewed and updated.  Review of Systems: As above  Physical Exam:  vitals were not taken for this  visit.  Wt Readings from Last 3 Encounters:  01/29/16 311 lb (141.069 kg)  01/21/16 311 lb (141.069 kg)  12/24/15 314 lb (142.429 kg)     Obese white female in no obvious distress. Head and neck exam shows no ocular or oral lesions. She has no palpable cervical or supraclavicular lymph nodes. Lungs are clear. Cardiac exam regular rate and rhythm with no murmurs, rubs or bruits. Abdomen is soft. She really is not showing her pregnancy yet. She has good bowel sounds. There is no fluid wave. There is no guarding or rebound tenderness. Back exam shows no tenderness over the spine, ribs or hips. Extremities shows no clubbing, cyanosis or edema. She has no venous cord in the legs. She has a negative Homans sign. Skin exam shows no rashes, ecchymoses or petechia. Neurological exam shows no focal neurological deficits.  Lab Results  Component Value Date   WBC 10.1* 04/04/2016   HGB 12.3 04/04/2016   HCT 36.4 04/04/2016   MCV 93 04/04/2016   PLT 360 04/04/2016     Chemistry      Component Value Date/Time   NA 139 02/02/2016 0555   NA 138 12/24/2015 1504   NA 137 07/19/2015 1416   NA 134 06/07/2015 1447   K 4.2 02/02/2016 0555   K 4.2 12/24/2015 1504   K 4.3 07/19/2015 1416   K 4.6 06/07/2015 1447   CL 105 02/02/2016 0555   CL 106 12/24/2015 1504   CL 106 06/07/2015  1447   CO2 24 02/02/2016 0555   CO2 20 12/24/2015 1504   CO2 21* 07/19/2015 1416   CO2 25 06/07/2015 1447   BUN 12 02/02/2016 0555   BUN 7 12/24/2015 1504   BUN 10.0 07/19/2015 1416   BUN 10 06/07/2015 1447   CREATININE 0.74 02/02/2016 0555   CREATININE 0.62 12/24/2015 1504   CREATININE 0.7 07/19/2015 1416   CREATININE 0.8 06/07/2015 1447      Component Value Date/Time   CALCIUM 9.3 02/02/2016 0555   CALCIUM 10.0 12/24/2015 1504   CALCIUM 10.1 07/19/2015 1416   CALCIUM 10.3 06/07/2015 1447   ALKPHOS 123 02/02/2016 0555   ALKPHOS 154* 12/24/2015 1504   ALKPHOS 70 06/07/2015 1447   AST 25 02/02/2016 0555   AST  12 12/24/2015 1504   AST 31 06/07/2015 1447   ALT 19 02/02/2016 0555   ALT 15 12/24/2015 1504   ALT 39 06/07/2015 1447   BILITOT 0.4 02/02/2016 0555   BILITOT <0.2 12/24/2015 1504   BILITOT 0.50 06/07/2015 1447         Impression and Plan: Ms. [name] is 40 year old white female. She looks great. Her baby is doing well. I'm just so happy that she got through this pregnancy without any real problems.  At this point, we can just let her go from the clinic. We don't have to have her on any anticoagulation..  If, for service, she gets pregnant and the future, we will be 1 happy to see her back.  Of note, we did give her baby a Betty Perkins the Cat doll. I think the baby enjoy this.  Josph Macho, MD 7/21/20179:50 AM

## 2016-04-05 LAB — APTT: aPTT: 26 s (ref 24–33)

## 2016-06-20 DIAGNOSIS — N920 Excessive and frequent menstruation with regular cycle: Secondary | ICD-10-CM | POA: Diagnosis not present

## 2016-07-08 DIAGNOSIS — N920 Excessive and frequent menstruation with regular cycle: Secondary | ICD-10-CM | POA: Diagnosis not present

## 2016-07-14 ENCOUNTER — Other Ambulatory Visit: Payer: Self-pay | Admitting: Obstetrics & Gynecology

## 2016-07-14 DIAGNOSIS — N920 Excessive and frequent menstruation with regular cycle: Secondary | ICD-10-CM | POA: Diagnosis not present

## 2016-07-14 DIAGNOSIS — N840 Polyp of corpus uteri: Secondary | ICD-10-CM | POA: Diagnosis not present

## 2016-07-29 DIAGNOSIS — N939 Abnormal uterine and vaginal bleeding, unspecified: Secondary | ICD-10-CM | POA: Diagnosis not present

## 2016-07-29 DIAGNOSIS — N840 Polyp of corpus uteri: Secondary | ICD-10-CM | POA: Diagnosis not present

## 2016-07-29 DIAGNOSIS — L309 Dermatitis, unspecified: Secondary | ICD-10-CM | POA: Diagnosis not present

## 2016-07-29 DIAGNOSIS — Z23 Encounter for immunization: Secondary | ICD-10-CM | POA: Diagnosis not present

## 2016-07-31 ENCOUNTER — Encounter (HOSPITAL_COMMUNITY): Payer: Self-pay | Admitting: *Deleted

## 2016-08-01 ENCOUNTER — Other Ambulatory Visit: Payer: Self-pay | Admitting: Obstetrics & Gynecology

## 2016-08-13 NOTE — Patient Instructions (Addendum)
Your procedure is scheduled on:  Tuesday, Dec. 5, 2017  [facility] at:  11:45 AM  Pick up the phone at the desk and dial [phone].  Call this number if you have problems the morning of surgery: [phone].  Remember: Do NOT eat food:  After Midnight Monday  Do NOT drink clear liquids after:  9:00 AM day of surgery  Take these medicines the morning of surgery with a SIP OF WATER:  None  Stop ALL herbal medications at this time   Do NOT wear jewelry (body piercing), metal hair clips/bobby pins, make-up, or nail polish. Do NOT wear lotions, powders, or perfumes.  You may wear deodorant. Do NOT shave for 48 hours prior to surgery. Do NOT bring valuables to the hospital. Contacts, dentures, or bridgework may not be worn into surgery.  Have a responsible adult drive you home and stay with you for 24 hours after your procedure

## 2016-08-15 ENCOUNTER — Encounter (HOSPITAL_COMMUNITY): Payer: Self-pay

## 2016-08-15 ENCOUNTER — Encounter (HOSPITAL_COMMUNITY)
Admission: RE | Admit: 2016-08-15 | Discharge: 2016-08-15 | Disposition: A | Discharge status: Home / Self Care | Payer: BLUE CROSS/BLUE SHIELD | Source: Ambulatory Visit | Attending: Obstetrics & Gynecology | Admitting: Obstetrics & Gynecology

## 2016-08-15 DIAGNOSIS — N840 Polyp of corpus uteri: Secondary | ICD-10-CM | POA: Diagnosis not present

## 2016-08-15 DIAGNOSIS — Z01812 Encounter for preprocedural laboratory examination: Secondary | ICD-10-CM | POA: Diagnosis not present

## 2016-08-15 HISTORY — DX: Personal history of urinary calculi: Z87.442

## 2016-08-15 LAB — CBC
HEMATOCRIT: 34.5 % — AB (ref 36.0–46.0)
Hemoglobin: 11.4 g/dL — ABNORMAL LOW (ref 12.0–15.0)
MCH: 28.7 pg (ref 26.0–34.0)
MCHC: 33 g/dL (ref 30.0–36.0)
MCV: 86.9 fL (ref 78.0–100.0)
PLATELETS: 303 10*3/uL (ref 150–400)
RBC: 3.97 MIL/uL (ref 3.87–5.11)
RDW: 13.9 % (ref 11.5–15.5)
WBC: 8.1 10*3/uL (ref 4.0–10.5)

## 2016-08-18 NOTE — H&P (Signed)
Skeet LatchJennifer L Chmiel is an 40 y.o. female P1 here for Dilation and Currettage Hysteroscopy, possible myosure, for history of irregular prolonged heavy menses.  Office pelvic ultrasound showed thickened endometrial stripe of 2cm.  Endometrial biopsy showed endometrial polyp.    Pertinent Gynecological History: As above.   Patient's last menstrual period was 08/09/2016 (approximate).    Past Medical History:  Diagnosis Date  . DVT (deep venous thrombosis) (HCC)    linked to birth control, but has family history of PE, test negative for factor V  . History of kidney stones   . Pulmonary embolism (HCC)    linked to birth control, but has family history of PE, test negative for factor V  . Superficial thrombophlebitis 12/2011    Past Surgical History:  Procedure Laterality Date  . ANKLE SURGERY     right ankle  . CESAREAN SECTION N/A 01/31/2016   Procedure: CESAREAN SECTION;  Surgeon: Jaymes GraffNaima Dillard, MD;  Location: [facility];  Service: Obstetrics;  Laterality: N/A;  . FOOT SURGERY     left foot    Family History  Problem Relation Age of Onset  . Deep vein thrombosis Other   . Deep vein thrombosis Mother   . Hypertension Mother   . Deep vein thrombosis Father   . Hypertension Father   . Deep vein thrombosis Brother   . Deep vein thrombosis Maternal Aunt   . Stroke Maternal Grandmother   . Diabetes Maternal Grandmother   . Hypertension Maternal Grandmother   . Cancer Maternal Grandfather     lung  . Cancer Paternal Grandfather     head/neck    Social History:  reports that she quit smoking about 9 years ago. Her smoking use included Cigarettes. She started smoking about 19 years ago. She has a 10.00 pack-year smoking history. She has never used smokeless tobacco. She reports that she does not drink alcohol or use drugs.  Allergies:  Allergies  Allergen Reactions  . Augmentin [Amoxicillin-Pot Clavulanate] Nausea And Vomiting    Has patient had a PCN reaction causing  immediate rash, facial/tongue/throat swelling, SOB or lightheadedness with hypotension: no Has patient had a PCN reaction causing severe rash involving mucus membranes or skin necrosis: no Has patient had a PCN reaction that required hospitalization no Has patient had a PCN reaction occurring within the last 10 years: no If all of the above answers are "NO", then may proceed with Cephalosporin use.     No prescriptions prior to admission.    ROS Constitutional: Denies fevers/chills Cardiovascular: Denies chest pain or palpitations Pulmonary: Denies coughing or wheezing Gastrointestinal: Denies nausea, vomiting or diarrhea Genitourinary: Denies pelvic pain, unusual vaginal discharge, dysuria, urgency or frequency.  With unusual vaginal bleeding Musculoskeletal: Denies muscle or joint aches and pain.  Neurology: Denies abnormal sensations such as tingling or numbness.   Last menstrual period 08/09/2016, unknown if currently breastfeeding. Physical Exam Constitutional: She is oriented to person, place, and time. She appears well-developed and well-nourished.  HENT:  Head: Normocephalic and atraumatic.  Neck: Normal range of motion.  Cardiovascular: Normal rate, regular rhythm and normal heart sounds.   Respiratory: Effort normal and breath sounds normal.  GI: Soft. Bowel sounds are normal.  Neurological: She is alert and oriented to person, place, and time.  Skin: Skin is warm and dry.  Psychiatric: She has a normal mood and affect. Her behavior is normal.  No results found for this or any previous visit (from the past 24 hour(s)).  No results  found. CBC    Component Value Date/Time   WBC 8.1 08/15/2016 1005   RBC 3.97 08/15/2016 1005   HGB 11.4 (L) 08/15/2016 1005   HGB 12.3 04/04/2016 0915   HGB 12.6 12/27/2012 1453   HCT 34.5 (L) 08/15/2016 1005   HCT 36.4 04/04/2016 0915   HCT 38.1 12/27/2012 1453   PLT 303 08/15/2016 1005   PLT 360 04/04/2016 0915   PLT 283  12/27/2012 1453   MCV 86.9 08/15/2016 1005   MCV 93 04/04/2016 0915   MCV 88.3 12/27/2012 1453   MCH 28.7 08/15/2016 1005   MCHC 33.0 08/15/2016 1005   RDW 13.9 08/15/2016 1005   RDW 12.8 04/04/2016 0915   RDW 12.9 12/27/2012 1453   LYMPHSABS 2.0 04/04/2016 0915   LYMPHSABS 1.8 12/27/2012 1453   MONOABS 0.6 04/29/2013 1655   MONOABS 0.4 12/27/2012 1453   EOSABS 0.2 04/04/2016 0915   BASOSABS 0.1 04/04/2016 0915   BASOSABS 0.0 12/27/2012 1453   Assessment/Plan: 40/o female with irregular prolonged heavy bleeding, with endometrial polyp, here for dilation and curettage hysteroscopy, possible myosure,  Admit to Day Surgery NPO IVF Consent for procedure.   [facility] Surgery CenterKULWA,Aasia Peavler [facility] HospitalWAKURU 08/18/2016, 9:04 PM

## 2016-08-19 ENCOUNTER — Ambulatory Visit (HOSPITAL_COMMUNITY): Payer: BLUE CROSS/BLUE SHIELD | Admitting: Anesthesiology

## 2016-08-19 ENCOUNTER — Encounter (HOSPITAL_COMMUNITY): Payer: Self-pay | Admitting: Emergency Medicine

## 2016-08-19 ENCOUNTER — Ambulatory Visit (HOSPITAL_COMMUNITY)
Admission: RE | Admit: 2016-08-19 | Discharge: 2016-08-19 | Disposition: A | Discharge status: Home / Self Care | Payer: BLUE CROSS/BLUE SHIELD | Source: Ambulatory Visit | Attending: Obstetrics & Gynecology | Admitting: Obstetrics & Gynecology

## 2016-08-19 ENCOUNTER — Encounter (HOSPITAL_COMMUNITY)
Admission: RE | Disposition: A | Discharge status: Home / Self Care | Payer: Self-pay | Source: Ambulatory Visit | Attending: Obstetrics & Gynecology

## 2016-08-19 DIAGNOSIS — N939 Abnormal uterine and vaginal bleeding, unspecified: Secondary | ICD-10-CM | POA: Diagnosis not present

## 2016-08-19 DIAGNOSIS — I739 Peripheral vascular disease, unspecified: Secondary | ICD-10-CM | POA: Diagnosis not present

## 2016-08-19 DIAGNOSIS — Z87891 Personal history of nicotine dependence: Secondary | ICD-10-CM | POA: Diagnosis not present

## 2016-08-19 DIAGNOSIS — Z880 Allergy status to penicillin: Secondary | ICD-10-CM | POA: Insufficient documentation

## 2016-08-19 DIAGNOSIS — N841 Polyp of cervix uteri: Secondary | ICD-10-CM | POA: Diagnosis not present

## 2016-08-19 DIAGNOSIS — Z86711 Personal history of pulmonary embolism: Secondary | ICD-10-CM | POA: Insufficient documentation

## 2016-08-19 DIAGNOSIS — Z86718 Personal history of other venous thrombosis and embolism: Secondary | ICD-10-CM | POA: Diagnosis not present

## 2016-08-19 DIAGNOSIS — N840 Polyp of corpus uteri: Secondary | ICD-10-CM | POA: Diagnosis not present

## 2016-08-19 HISTORY — PX: DILATATION & CURETTAGE/HYSTEROSCOPY WITH MYOSURE: SHX6511

## 2016-08-19 LAB — PREGNANCY, URINE: PREG TEST UR: NEGATIVE

## 2016-08-19 SURGERY — DILATATION & CURETTAGE/HYSTEROSCOPY WITH MYOSURE
Anesthesia: General | Site: Uterus

## 2016-08-19 MED ORDER — FENTANYL CITRATE (PF) 100 MCG/2ML IJ SOLN
INTRAMUSCULAR | Status: AC
  Filled 2016-08-19: qty 2

## 2016-08-19 MED ORDER — PROPOFOL 10 MG/ML IV BOLUS
INTRAVENOUS | Status: AC
  Filled 2016-08-19: qty 20

## 2016-08-19 MED ORDER — GLYCOPYRROLATE 0.2 MG/ML IJ SOLN
INTRAMUSCULAR | Status: AC
  Filled 2016-08-19: qty 1

## 2016-08-19 MED ORDER — BUPIVACAINE-EPINEPHRINE 0.5% -1:200000 IJ SOLN
INTRAMUSCULAR | Status: DC | PRN
Start: 2016-08-19 — End: 2016-08-19
  Administered 2016-08-19: 10 mL

## 2016-08-19 MED ORDER — GLYCOPYRROLATE 0.2 MG/ML IJ SOLN
INTRAMUSCULAR | Status: DC | PRN
  Administered 2016-08-19 (×2): 0.1 mg via INTRAVENOUS

## 2016-08-19 MED ORDER — DEXAMETHASONE SODIUM PHOSPHATE 10 MG/ML IJ SOLN
INTRAMUSCULAR | Status: AC
  Filled 2016-08-19: qty 1

## 2016-08-19 MED ORDER — SCOPOLAMINE 1 MG/3DAYS TD PT72
1.0000 | MEDICATED_PATCH | Freq: Once | TRANSDERMAL | Status: DC
  Administered 2016-08-19: 1.5 mg via TRANSDERMAL

## 2016-08-19 MED ORDER — SCOPOLAMINE 1 MG/3DAYS TD PT72
MEDICATED_PATCH | TRANSDERMAL | Status: AC
  Administered 2016-08-19: 1.5 mg via TRANSDERMAL
  Filled 2016-08-19: qty 1

## 2016-08-19 MED ORDER — HYDROMORPHONE HCL 1 MG/ML IJ SOLN: 0.2500 mg | INTRAMUSCULAR | Status: DC | PRN

## 2016-08-19 MED ORDER — OXYCODONE-ACETAMINOPHEN 5-325 MG PO TABS: 1.0000 | ORAL_TABLET | ORAL | 0 refills | Status: DC | PRN

## 2016-08-19 MED ORDER — SODIUM CHLORIDE 0.9 % IR SOLN
Status: DC | PRN
  Administered 2016-08-19 (×2): 3000 mL

## 2016-08-19 MED ORDER — ONDANSETRON HCL 4 MG/2ML IJ SOLN
INTRAMUSCULAR | Status: AC
  Filled 2016-08-19: qty 2

## 2016-08-19 MED ORDER — MIDAZOLAM HCL 2 MG/2ML IJ SOLN
INTRAMUSCULAR | Status: DC | PRN
  Administered 2016-08-19: 2 mg via INTRAVENOUS

## 2016-08-19 MED ORDER — ONDANSETRON HCL 4 MG/2ML IJ SOLN
INTRAMUSCULAR | Status: DC | PRN
  Administered 2016-08-19: 4 mg via INTRAVENOUS

## 2016-08-19 MED ORDER — PROPOFOL 10 MG/ML IV BOLUS
INTRAVENOUS | Status: DC | PRN
  Administered 2016-08-19: 250 mg via INTRAVENOUS

## 2016-08-19 MED ORDER — MEPERIDINE HCL 25 MG/ML IJ SOLN: 6.2500 mg | INTRAMUSCULAR | Status: DC | PRN

## 2016-08-19 MED ORDER — FENTANYL CITRATE (PF) 100 MCG/2ML IJ SOLN
INTRAMUSCULAR | Status: DC | PRN
  Administered 2016-08-19 (×4): 50 ug via INTRAVENOUS

## 2016-08-19 MED ORDER — LIDOCAINE HCL (CARDIAC) 20 MG/ML IV SOLN
INTRAVENOUS | Status: DC | PRN
  Administered 2016-08-19: 100 mg via INTRAVENOUS

## 2016-08-19 MED ORDER — DEXAMETHASONE SODIUM PHOSPHATE 10 MG/ML IJ SOLN
INTRAMUSCULAR | Status: DC | PRN
Start: 2016-08-19 — End: 2016-08-19
  Administered 2016-08-19: 10 mg via INTRAVENOUS

## 2016-08-19 MED ORDER — FERRIC SUBSULFATE 259 MG/GM EX SOLN
CUTANEOUS | Status: AC
Start: 2016-08-19 — End: 2016-08-19
  Filled 2016-08-19: qty 8

## 2016-08-19 MED ORDER — KETOROLAC TROMETHAMINE 30 MG/ML IJ SOLN
INTRAMUSCULAR | Status: AC
  Filled 2016-08-19: qty 1

## 2016-08-19 MED ORDER — LIDOCAINE HCL (CARDIAC) 20 MG/ML IV SOLN
INTRAVENOUS | Status: AC
  Filled 2016-08-19: qty 5

## 2016-08-19 MED ORDER — IBUPROFEN 800 MG PO TABS: 800.0000 mg | ORAL_TABLET | Freq: Three times a day (TID) | ORAL | 0 refills | Status: DC

## 2016-08-19 MED ORDER — OXYCODONE HCL 5 MG/5ML PO SOLN: 5.0000 mg | Freq: Once | ORAL | Status: DC | PRN

## 2016-08-19 MED ORDER — FERRIC SUBSULFATE 259 MG/GM EX SOLN
CUTANEOUS | Status: DC | PRN
  Administered 2016-08-19: 1

## 2016-08-19 MED ORDER — KETOROLAC TROMETHAMINE 30 MG/ML IJ SOLN
INTRAMUSCULAR | Status: DC | PRN
  Administered 2016-08-19: 30 mg via INTRAVENOUS

## 2016-08-19 MED ORDER — MIDAZOLAM HCL 2 MG/2ML IJ SOLN
INTRAMUSCULAR | Status: AC
  Filled 2016-08-19: qty 2

## 2016-08-19 MED ORDER — PROMETHAZINE HCL 25 MG/ML IJ SOLN: 6.2500 mg | INTRAMUSCULAR | Status: DC | PRN

## 2016-08-19 MED ORDER — OXYCODONE HCL 5 MG PO TABS: 5.0000 mg | ORAL_TABLET | Freq: Once | ORAL | Status: DC | PRN

## 2016-08-19 MED ORDER — BUPIVACAINE-EPINEPHRINE (PF) 0.5% -1:200000 IJ SOLN
INTRAMUSCULAR | Status: AC
  Filled 2016-08-19: qty 30

## 2016-08-19 MED ORDER — LACTATED RINGERS IV SOLN
INTRAVENOUS | Status: DC
  Administered 2016-08-19 (×2): via INTRAVENOUS

## 2016-08-19 MED ORDER — SILVER NITRATE-POT NITRATE 75-25 % EX MISC
CUTANEOUS | Status: DC | PRN
  Administered 2016-08-19: 5

## 2016-08-19 SURGICAL SUPPLY — 23 items
BIPOLAR CUTTING LOOP 21FR (ELECTRODE)
CANISTER SUCT 3000ML (MISCELLANEOUS) ×2 IMPLANT
CATH ROBINSON RED A/P 16FR (CATHETERS) ×2 IMPLANT
CLOTH BEACON ORANGE TIMEOUT ST (SAFETY) ×2 IMPLANT
CONTAINER PREFILL 10% NBF 60ML (FORM) ×4 IMPLANT
DEVICE MYOSURE LITE (MISCELLANEOUS) IMPLANT
DEVICE MYOSURE REACH (MISCELLANEOUS) ×2 IMPLANT
ELECT REM PT RETURN 9FT ADLT (ELECTROSURGICAL)
ELECTRODE REM PT RTRN 9FT ADLT (ELECTROSURGICAL) IMPLANT
GLOVE BIOGEL PI IND STRL 7.0 (GLOVE) ×1 IMPLANT
GLOVE BIOGEL PI IND STRL 8.5 (GLOVE) ×1 IMPLANT
GLOVE BIOGEL PI INDICATOR 7.0 (GLOVE) ×1
GLOVE BIOGEL PI INDICATOR 8.5 (GLOVE) ×1
GLOVE SURG SS PI 6.5 STRL IVOR (GLOVE) ×4 IMPLANT
GOWN STRL REUS W/TWL LRG LVL3 (GOWN DISPOSABLE) ×4 IMPLANT
LOOP CUTTING BIPOLAR 21FR (ELECTRODE) IMPLANT
PACK VAGINAL MINOR WOMEN LF (CUSTOM PROCEDURE TRAY) ×2 IMPLANT
PAD OB MATERNITY 4.3X12.25 (PERSONAL CARE ITEMS) ×2 IMPLANT
SEAL ROD LENS SCOPE MYOSURE (ABLATOR) ×2 IMPLANT
TOWEL OR 17X24 6PK STRL BLUE (TOWEL DISPOSABLE) ×4 IMPLANT
TUBING AQUILEX INFLOW (TUBING) ×2 IMPLANT
TUBING AQUILEX OUTFLOW (TUBING) ×2 IMPLANT
WATER STERILE IRR 1000ML POUR (IV SOLUTION) ×2 IMPLANT

## 2016-08-19 NOTE — Anesthesia Preprocedure Evaluation (Signed)
Anesthesia Evaluation  Patient identified by MRN, date of birth, ID band Patient awake    Reviewed: Allergy & Precautions, H&P , NPO status , Patient's Chart, lab work & pertinent test results  History of Anesthesia Complications Negative for: history of anesthetic complications  Airway Mallampati: II  TM Distance: >3 FB Neck ROM: full    Dental no notable dental hx. (+) Teeth Intact   Pulmonary neg pulmonary ROS, former smoker,    Pulmonary exam normal breath sounds clear to auscultation       Cardiovascular hypertension, + Peripheral Vascular Disease  DVT: with PE.  Normal cardiovascular exam Rhythm:regular Rate:Normal     Neuro/Psych  Headaches, negative psych ROS   GI/Hepatic negative GI ROS, Neg liver ROS,   Endo/Other  negative endocrine ROSMorbid obesity  Renal/GU negative Renal ROS  negative genitourinary   Musculoskeletal   Abdominal   Peds  Hematology negative hematology ROS (+)   Anesthesia Other Findings G/o dvt with PE. Was on lovenox, now heparin. Last dose yesterday. Labs good.  Reproductive/Obstetrics negative OB ROS                             Anesthesia Physical  Anesthesia Plan  ASA: III  Anesthesia Plan: General   Post-op Pain Management:    Induction: Intravenous  Airway Management Planned: LMA  Additional Equipment:   Intra-op Plan:   Post-operative Plan: Extubation in OR  Informed Consent: I have reviewed the patients History and Physical, chart, labs and discussed the procedure including the risks, benefits and alternatives for the proposed anesthesia with the patient or authorized representative who has indicated his/her understanding and acceptance.   Dental advisory given  Plan Discussed with: CRNA  Anesthesia Plan Comments:         Anesthesia Quick Evaluation

## 2016-08-19 NOTE — Discharge Instructions (Addendum)
Ms. Betty DestineJennifer Akhter,  Here are additional instructions.   1. Nothing in vagina x 2 weeks.  2. Expect some vaginal bleeding and yellow/grey/black discharge for next several days, call me if with excessive bleeding requiring you to change a pad every hour.  3.  Expect some abdominal cramping, take pain medication as needed.  Call me if abdominal pain is intolerable despite medication use. 4. Please keep your appointment on 12/14 for post procedure check.  Sincerely, Dr. Sallye OberKulwa.  Phone:  [phone].   Post Anesthesia Home Care Instructions  NO IBUPROFEN PRODUCTS UNTIL: 8 PM TONIGHT  Activity: Get plenty of rest for the remainder of the day. A responsible adult should stay with you for 24 hours following the procedure.  For the next 24 hours, DO NOT: -Drive a car -Advertising copywriterperate machinery -Drink alcoholic beverages -Take any medication unless instructed by your physician -Make any legal decisions or sign important papers.  Meals: Start with liquid foods such as gelatin or soup. Progress to regular foods as tolerated. Avoid greasy, spicy, heavy foods. If nausea and/or vomiting occur, drink only clear liquids until the nausea and/or vomiting subsides. Call your physician if vomiting continues.  Special Instructions/Symptoms: Your throat may feel dry or sore from the anesthesia or the breathing tube placed in your throat during surgery. If this causes discomfort, gargle with warm salt water. The discomfort should disappear within 24 hours.  If you had a scopolamine patch placed behind your ear for the management of post- operative nausea and/or vomiting:  1. The medication in the patch is effective for 72 hours, after which it should be removed.  Wrap patch in a tissue and discard in the trash. Wash hands thoroughly with soap and water. 2. You may remove the patch earlier than 72 hours if you experience unpleasant side effects which may include dry mouth, dizziness or visual disturbances. 3.  Avoid touching the patch. Wash your hands with soap and water after contact with the patch.

## 2016-08-19 NOTE — Anesthesia Procedure Notes (Signed)
Procedure Name: LMA Insertion Date/Time: 08/19/2016 1:15 PM Performed by: Earmon PhoenixWILKERSON, Ayman Brull P Pre-anesthesia Checklist: Patient identified, Emergency Drugs available, Suction available, Patient being monitored and Timeout performed Patient Re-evaluated:Patient Re-evaluated prior to inductionOxygen Delivery Method: Circle system utilized Preoxygenation: Pre-oxygenation with 100% oxygen Intubation Type: IV induction Ventilation: Mask ventilation without difficulty LMA: LMA with gastric port inserted LMA Size: 4.0 Number of attempts: 1 Placement Confirmation: positive ETCO2,  CO2 detector and breath sounds checked- equal and bilateral Tube secured with: Tape Dental Injury: Teeth and Oropharynx as per pre-operative assessment

## 2016-08-19 NOTE — Brief Op Note (Signed)
08/19/2016  2:44 PM  PATIENT:  Betty Perkins  40 y.o. female  PRE-OPERATIVE DIAGNOSIS:  Irregular prolonged uterine bleeding, Endometrial Polyp, History of Pulmonary Embolus  POST-OPERATIVE DIAGNOSIS:  ENDOMETRIAL POLYP  PROCEDURE:  Procedure(s): DILATATION & CURETTAGE/HYSTEROSCOPY WITH MYOSURE (N/A)  SURGEON:  Surgeon(s) and Role:    * Hoover BrownsEma Braelyn Bordonaro, MD - Primary   ASSISTANTS: Scrub technician  ANESTHESIA:   general  EBL:  Total I/O In: 1500 [I.V.:1500] Out: 190 [Urine:150; Blood:40]  Myosure fluid deficit: 250 cc.   BLOOD ADMINISTERED:none  DRAINS: none   LOCAL MEDICATIONS USED:  MARCAINE with epinephrine, 10 ml for paracervical block.    SPECIMEN:  Source of Specimen:  Endometrial lesion suspect polyp, endocervical currettings.   DISPOSITION OF SPECIMEN:  PATHOLOGY  COUNTS:  YES  DICTATION: .Note written in EPIC  PLAN OF CARE: Discharge to home after PACU  PATIENT DISPOSITION:  PACU - hemodynamically stable.   Delay start of Pharmacological VTE agent (>24hrs) due to surgical blood loss or risk of bleeding: not applicable

## 2016-08-19 NOTE — Transfer of Care (Signed)
Immediate Anesthesia Transfer of Care Note  Patient: Betty LatchJennifer L Perkins  Procedure(s) Performed: Procedure(s): DILATATION & CURETTAGE/HYSTEROSCOPY WITH MYOSURE (N/A)  Patient Location: PACU  Anesthesia Type:General  Level of Consciousness: awake, alert  and oriented  Airway & Oxygen Therapy: Patient Spontanous Breathing and Patient connected to nasal cannula oxygen  Post-op Assessment: Report given to RN and Post -op Vital signs reviewed and stable  Post vital signs: Reviewed and stable  Last Vitals:  Vitals:   08/19/16 1151  BP: 135/77  Pulse: 68  Resp: 15  Temp: 36.4 C    Last Pain:  Vitals:   08/19/16 1151  TempSrc: Oral      Patients Stated Pain Goal: 3 (08/19/16 1151)  Complications: No apparent anesthesia complications

## 2016-08-19 NOTE — Anesthesia Postprocedure Evaluation (Signed)
Anesthesia Post Note  Patient: Betty LatchJennifer L Loth  Procedure(s) Performed: Procedure(s) (LRB): DILATATION & CURETTAGE/HYSTEROSCOPY WITH MYOSURE (N/A)  Patient location during evaluation: PACU Anesthesia Type: General Level of consciousness: sedated and patient cooperative Pain management: pain level controlled Vital Signs Assessment: post-procedure vital signs reviewed and stable Respiratory status: spontaneous breathing Cardiovascular status: stable Anesthetic complications: no     Last Vitals:  Vitals:   08/19/16 1425 08/19/16 1430  BP: 140/88 (!) 143/82  Pulse: 78 75  Resp: 12 14  Temp: 36.9 C     Last Pain:  Vitals:   08/19/16 1151  TempSrc: Oral   Pain Goal: Patients Stated Pain Goal: 3 (08/19/16 1151)               Lewie LoronJohn Aanshi Batchelder

## 2016-08-19 NOTE — Op Note (Addendum)
Betty Perkins, [name], 40 y.o., [birth year]  PREOP DIAGNOSIS:  1. Abnormal uterine bleeding: Irregular prolonged uterine bleeding.   2.  Endometrial polyp.   Post operative diagnosis:  1. Same as above 2.  Endocervical polyps  Procedure: Dilation and Curettage Hysteroscopy with Myosure.     Surgeon: Dr. Hoover BrownsEma Sherrine Salberg  Assistant: Scrub technician  Anesthesiologist: General anesthesia  Complications: None  IV fluid: 1500 cc normal saline  Fluid deficit: 250 cc  EBL: 40 cc  Indications: 40 year old para 1 with a history of irregular prolonged heavy periods.  Patient had an ultrasound that showed a thickened endometrium that was . Office endometrial biopsy showed an endometrial polyp. She was taken to the operating room for removal of the lesion.    Procedure:  Informed consent was obtained from the patient to undergo the procedure. She was taken to the operating room and anesthesia was administered without difficulty. She was prepped and draped in the usual sterile fashion. She was straight catheterized. An exam was then performed under anesthesia revealing a small anteverted uterus and a closed cervix. There were no palpable adnexal masses.  A graves speculum was placed in and the cervix was viewed. Small blood was noted at the cervical os.  Single-tooth tenaculum was placed anteriorly on the cervix. The cervix was dilated with Pratt dilators to # 19.  The Myosure diagnostic scope was then placed to view the uterine cavity and several small pink endometrial lesions that looked like polyps were noted within the cavity.  These lesions were resected using the Myosure REACH under direct visualization without any complications. She tolerated the procedure well. The tubal ostia were noted bilaterally at the end of the procedure and uterine cavity was clear.  Some endocervical lesions, that were also pink and fleshy were noted with the scope, and these were removed with the endocervical  curette.  No other lesions were noted. The instruments were then removed.  Silver nitrate and Monsel's solution were used to enhance hemostasis at tenaculum site. The patient was then awoken from anesthesia. She was cleaned and taken to recovery room in stable condition  Specimen: 1.  Endometrial lesions suspicious for endometrial polyps.  2.  Endocervical curettings, suspicious for polyps.   Hoover BrownsEma [name], MD.  08/19/2016: 1455.

## 2016-08-19 NOTE — Interval H&P Note (Signed)
History and Physical Interval Note:  08/19/2016 12:48 PM Urine pregnancy test is negative.   Betty Perkins  has presented today for surgery, with the diagnosis of Endometrial Polyp, History of Pulmonary Embolus  The various methods of treatment have been discussed with the patient and family. After consideration of risks, benefits and other options for treatment, the patient has consented to  Procedure(s): DILATATION & CURETTAGE/HYSTEROSCOPY WITH MYOSURE (N/A) as a surgical intervention .  The patient's history has been reviewed, patient examined, no change in status, stable for surgery.  I have reviewed the patient's chart and labs.  Questions were answered to the patient's satisfaction.     [facility],Naziah Weckerly [facility]

## 2016-08-20 ENCOUNTER — Encounter (HOSPITAL_COMMUNITY): Payer: Self-pay | Admitting: Obstetrics & Gynecology

## 2016-08-28 DIAGNOSIS — Z09 Encounter for follow-up examination after completed treatment for conditions other than malignant neoplasm: Secondary | ICD-10-CM | POA: Diagnosis not present

## 2016-09-21 DIAGNOSIS — J0140 Acute pansinusitis, unspecified: Secondary | ICD-10-CM | POA: Diagnosis not present

## 2016-10-16 DIAGNOSIS — I809 Phlebitis and thrombophlebitis of unspecified site: Secondary | ICD-10-CM | POA: Diagnosis not present

## 2016-10-16 DIAGNOSIS — I10 Essential (primary) hypertension: Secondary | ICD-10-CM | POA: Diagnosis not present

## 2016-11-12 DIAGNOSIS — R350 Frequency of micturition: Secondary | ICD-10-CM | POA: Diagnosis not present

## 2016-11-12 DIAGNOSIS — Z87442 Personal history of urinary calculi: Secondary | ICD-10-CM | POA: Diagnosis not present

## 2016-11-12 DIAGNOSIS — N201 Calculus of ureter: Secondary | ICD-10-CM | POA: Diagnosis not present

## 2016-11-12 DIAGNOSIS — Z87891 Personal history of nicotine dependence: Secondary | ICD-10-CM | POA: Diagnosis not present

## 2016-11-12 DIAGNOSIS — N132 Hydronephrosis with renal and ureteral calculous obstruction: Secondary | ICD-10-CM | POA: Diagnosis not present

## 2016-11-12 DIAGNOSIS — R1031 Right lower quadrant pain: Secondary | ICD-10-CM | POA: Diagnosis not present

## 2016-11-12 DIAGNOSIS — K760 Fatty (change of) liver, not elsewhere classified: Secondary | ICD-10-CM | POA: Diagnosis not present

## 2016-11-21 DIAGNOSIS — J0140 Acute pansinusitis, unspecified: Secondary | ICD-10-CM | POA: Diagnosis not present

## 2017-01-06 ENCOUNTER — Encounter: Payer: Self-pay | Admitting: Gynecology

## 2017-01-13 ENCOUNTER — Encounter: Payer: Self-pay | Admitting: Obstetrics & Gynecology

## 2017-01-13 ENCOUNTER — Ambulatory Visit (INDEPENDENT_AMBULATORY_CARE_PROVIDER_SITE_OTHER): Payer: BLUE CROSS/BLUE SHIELD | Admitting: Obstetrics & Gynecology

## 2017-01-13 VITALS — BP 136/86 | Ht 66.0 in | Wt 313.0 lb

## 2017-01-13 DIAGNOSIS — Z1151 Encounter for screening for human papillomavirus (HPV): Secondary | ICD-10-CM | POA: Diagnosis not present

## 2017-01-13 DIAGNOSIS — Z01419 Encounter for gynecological examination (general) (routine) without abnormal findings: Secondary | ICD-10-CM

## 2017-01-13 DIAGNOSIS — Z8249 Family history of ischemic heart disease and other diseases of the circulatory system: Secondary | ICD-10-CM | POA: Diagnosis not present

## 2017-01-13 DIAGNOSIS — Z308 Encounter for other contraceptive management: Secondary | ICD-10-CM | POA: Diagnosis not present

## 2017-01-13 DIAGNOSIS — N921 Excessive and frequent menstruation with irregular cycle: Secondary | ICD-10-CM

## 2017-01-13 LAB — CBC
HCT: 38.3 % (ref 35.0–45.0)
Hemoglobin: 12.3 g/dL (ref 11.7–15.5)
MCH: 28.1 pg (ref 27.0–33.0)
MCHC: 32.1 g/dL (ref 32.0–36.0)
MCV: 87.6 fL (ref 80.0–100.0)
MPV: 10.7 fL (ref 7.5–12.5)
Platelets: 346 K/uL (ref 140–400)
RBC: 4.37 MIL/uL (ref 3.80–5.10)
RDW: 14.9 % (ref 11.0–15.0)
WBC: 8 K/uL (ref 3.8–10.8)

## 2017-01-13 LAB — TSH: TSH: 2.34 m[IU]/L

## 2017-01-13 NOTE — Progress Notes (Signed)
Betty Perkins December 07, 1975 [id number]   History:    41 y.o.  G1P1 married, daughter doing well 11+ mth old  RP:  New patient presenting for annual gyn exam and Menometrorrhagia  Had a C/S May 2017.  No BF.  Oligo-menorrhagia since then.  08/2016 HSC/Polyp resection/D+C.  Benign patho.  Then menses only in 11/2016 x 12 days, stopped finally with Provera PO.  No pelvic pain.  No abnormal vaginal d/c.  Obesity, but no change in weight compared with before pregnancy.    Past medical history,surgical history, family history and social history were all reviewed and documented in the EPIC chart.  Gynecologic History Patient's last menstrual period was 01/01/2017. Contraception: coitus interruptus Last Pap: 2 yrs ago. Results were: normal Last mammogram:  None.   Obstetric History OB History  Gravida Para Term Preterm AB Living  0 0 1  SAB TAB Ectopic Multiple Live Births  0 0 0 0 1    # Outcome Date GA Lbr Len/2nd Weight Sex Delivery Anes PTL Lv  1 Term 01/31/16 [redacted]w[redacted]d  7 lb 5.6 oz (3.335 kg) F CS-LTranv EPI  LIV       ROS: A ROS was performed and pertinent positives and negatives are included in the history.  GENERAL: No fevers or chills. HEENT: No change in vision, no earache, sore throat or sinus congestion. NECK: No pain or stiffness. CARDIOVASCULAR: No chest pain or pressure. No palpitations. PULMONARY: No shortness of breath, cough or wheeze. GASTROINTESTINAL: No abdominal pain, nausea, vomiting or diarrhea, melena or bright red blood per rectum. GENITOURINARY: No urinary frequency, urgency, hesitancy or dysuria. MUSCULOSKELETAL: No joint or muscle pain, no back pain, no recent trauma. DERMATOLOGIC: No rash, no itching, no lesions. ENDOCRINE: No polyuria, polydipsia, no heat or cold intolerance. No recent change in weight. HEMATOLOGICAL: No anemia or easy bruising or bleeding. NEUROLOGIC: No headache, seizures, numbness, tingling or weakness. PSYCHIATRIC: No depression, no  loss of interest in normal activity or change in sleep pattern.     Exam:   BP 136/86   Ht  (1.676 m)   Wt (!) 313 lb (142 kg)   LMP 01/01/2017   BMI 50.52 kg/m   Body mass index is 50.52 kg/m.  General appearance : Well developed well nourished female. No acute distress HEENT: Eyes: no retinal hemorrhage or exudates,  Neck supple, trachea midline, no carotid bruits, no thyroidmegaly Lungs: Clear to auscultation, no rhonchi or wheezes, or rib retractions  Heart: Regular rate and rhythm, no murmurs or gallops Breast:Examined in sitting and supine position were symmetrical in appearance, no palpable masses or tenderness,  no skin retraction, no nipple inversion, no nipple discharge, no skin discoloration, no axillary or supraclavicular lymphadenopathy Abdomen: no palpable masses or tenderness, no rebound or guarding Extremities: no edema or skin discoloration or tenderness  Pelvic:  Bartholin, Urethra, Skene Glands: Within normal limits             Vagina: No gross lesions or discharge  Cervix: No gross lesions or discharge.  Pap/HPV done.  Uterus  AV, normal size, shape and consistency, non-tender and mobile  Adnexa  Without masses or tenderness  Anus and perineum  Normal   Assessment/Plan:  41 y.o. female for annual exam   1. Encounter for routine gynecological examination with Papanicolaou smear of cervix Normal Gyn exam.  Pap/HPV HR done.  2. Menorrhagia with irregular cycle Oligo-Menorrhagia.  Endometrial polyps removed 08/2016.  Obesity. - CBC -  TSH - Prolactin - hCG, serum, qualitative - Hemoglobin A1c - US Transvaginal Non-OB; Future  3. Encounter for other contraceptive management Decision to proceed with [facility] IUD insertion at f/u if Pelvic US reassuring.  Progestin IUD for contraception and to control Menometrorrhagia as well as a method with low risk of blood clots.  Info and pamphlet given.  4. Family history of pulmonary embolism Patient not a  carrier of Thrombophilia factors  Counseling >50% x 20 min on above issues.   [name], 10:54 AM 01/13/2017

## 2017-01-13 NOTE — Addendum Note (Signed)
Addended by: Berna Spare A on: 01/13/2017 11:27 AM   Modules accepted: Orders

## 2017-01-13 NOTE — Patient Instructions (Signed)
1. Encounter for routine gynecological examination with Papanicolaou smear of cervix Normal Gyn exam.  Pap/HPV HR done.  2. Menorrhagia with irregular cycle Oligo-Menorrhagia.  Endometrial polyps removed 08/2016.  Obesity. - CBC - TSH - Prolactin - hCG, serum, qualitative - Hemoglobin A1c - US Transvaginal Non-OB; Future  3. Encounter for other contraceptive management Decision to proceed with [facility] IUD insertion at f/u if Pelvic US reassuring.  Progestin IUD for contraception and to control Menometrorrhagia as well as a method with low risk of blood clots.  Info and pamphlet given.  4. Family history of pulmonary embolism Patient not a carrier of Thrombophilia factors   Harleen, it was a pleasure to meet you today!  See you soon.

## 2017-01-14 LAB — HEMOGLOBIN A1C
Hgb A1c MFr Bld: 5.4 % (ref <5.7)
Mean Plasma Glucose: 108 mg/dL

## 2017-01-14 LAB — PROLACTIN: PROLACTIN: 5.5 ng/mL

## 2017-01-14 LAB — HCG, SERUM, QUALITATIVE: PREG SERUM: NEGATIVE

## 2017-01-15 LAB — PAP, TP IMAGING W/ HPV RNA, RFLX HPV TYPE 16,18/45: HPV MRNA, HIGH RISK: NOT DETECTED

## 2017-01-19 ENCOUNTER — Other Ambulatory Visit: Payer: Self-pay | Admitting: Obstetrics & Gynecology

## 2017-01-19 ENCOUNTER — Ambulatory Visit (INDEPENDENT_AMBULATORY_CARE_PROVIDER_SITE_OTHER): Payer: BLUE CROSS/BLUE SHIELD | Admitting: Obstetrics & Gynecology

## 2017-01-19 ENCOUNTER — Ambulatory Visit (INDEPENDENT_AMBULATORY_CARE_PROVIDER_SITE_OTHER): Payer: BLUE CROSS/BLUE SHIELD

## 2017-01-19 DIAGNOSIS — Z30430 Encounter for insertion of intrauterine contraceptive device: Secondary | ICD-10-CM

## 2017-01-19 DIAGNOSIS — T8339XA Other mechanical complication of intrauterine contraceptive device, initial encounter: Secondary | ICD-10-CM

## 2017-01-19 DIAGNOSIS — N921 Excessive and frequent menstruation with irregular cycle: Secondary | ICD-10-CM

## 2017-01-19 DIAGNOSIS — N83202 Unspecified ovarian cyst, left side: Secondary | ICD-10-CM

## 2017-01-19 LAB — PREGNANCY, URINE: Preg Test, Ur: NEGATIVE

## 2017-01-19 NOTE — Progress Notes (Signed)
    Betty Perkins 05/01/1976 [id number]        41 y.o.  G1P1001   RP:  Pelvic US and IUD insertion under US guidance  Menometrorrhagia.  No unprotected IC x >2 wks.  Past medical history,surgical history, problem list, medications, allergies, family history and social history were all reviewed and documented in the EPIC chart.  Directed ROS with pertinent positives and negatives documented in the history of present illness/assessment and plan.  Exam:  There were no vitals filed for this visit. General appearance:  Normal  UPT today neg.  Pelvic US today:  T/A and T/V:  Uterus AV, normal volume.  Endometrial line normal at 8.2 mm.  Ovaries wnl bilaterally with a simple cyst 3.5 cm on Lt Ovary.  No FF in CDS.                                                                    IUD procedure note       Patient presented to the office today for placement of [name] IUD. The patient had previously been provided with literature information on this method of contraception. The risks benefits and pros and cons were discussed and all her questions were answered. She is fully aware that this form of contraception is 99% effective and is good for 5 years.  Pelvic exam: Bartholin urethra Skene glands: Within normal limits Vagina: No lesions or discharge Cervix: No lesions or discharge Uterus: AV position Adnexa: No masses or tenderness Rectal exam: Not done  The cervix was cleansed with Betadine solution, hurricane spray was done. The IUD was shown to the patient and inserted in a sterile fashion under US guidance.  When the IUD strings were trimmed, it didn't cut them completely and the IUD pulled out.  We therefore proceeded with reinsertion under US guidance as above, but with a Mirena IUD instead after the patient gave her consent.  Patient was instructed to return back to the office in one month for follow up.       Assessment/Plan:  41 y.o. G1P1001   1. Encounter for IUD  insertion [name] IUD insertion under US guidance, but came out at time of string trimming.  Mirena IUD insertion under US guidance.  F/U IUD check in 4 weeks.  2. Menometrorrhagia Pelvic US normal uterus/endometrial line and ovaries.  No unprotected IC x >2 wks.  UPT neg today.     Betty DelMarie-Lyne Jaevin Medearis MD, 12:19 PM 01/19/2017

## 2017-01-19 NOTE — Patient Instructions (Signed)
1. Encounter for IUD insertion [name] IUD insertion under US guidance, but came out at time of string trimming.  Mirena IUD insertion under US guidance without difficulty.  F/U IUD check in 4 weeks.  2. Menometrorrhagia Pelvic US normal uterus/endometrial line and ovaries.  No unprotected IC x >2 wks.  UPT neg today.

## 2017-01-28 ENCOUNTER — Encounter: Payer: Self-pay | Admitting: Gynecology

## 2017-06-24 IMAGING — US US MFM OB TRANSVAGINAL
2 series · 14 of 28 positions shown · non-contrast
Comparison: none

[Series 1: us mfm ob transvaginal · 75 acquisitions, 9 frames shown (1 of 2)]
[im 5/75]
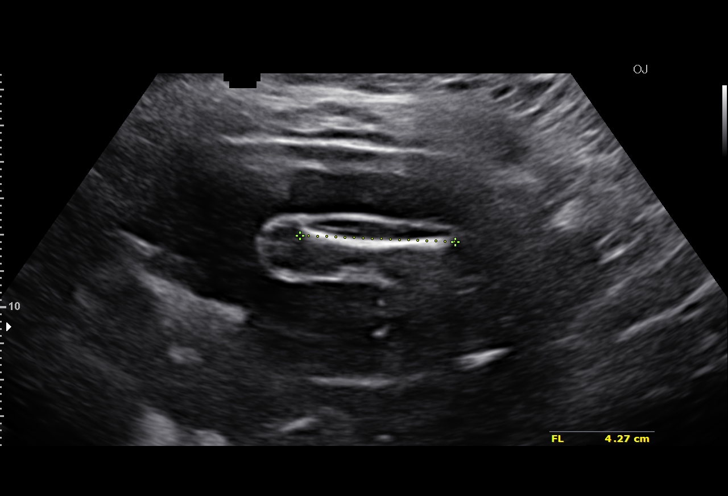
[im 14/75]
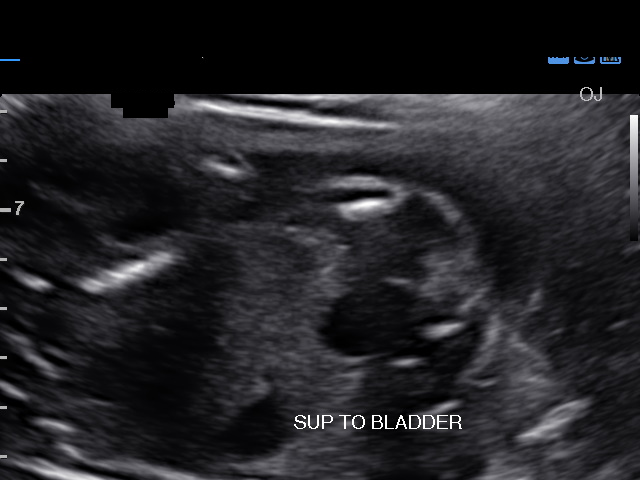
[im 22/75]
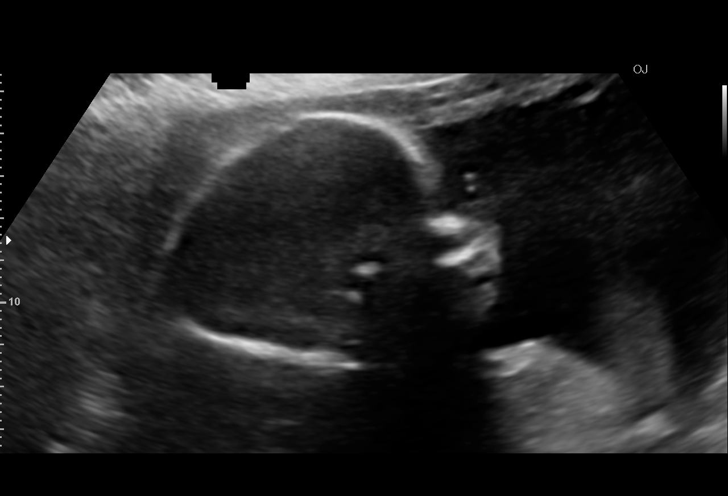
[im 31/75]
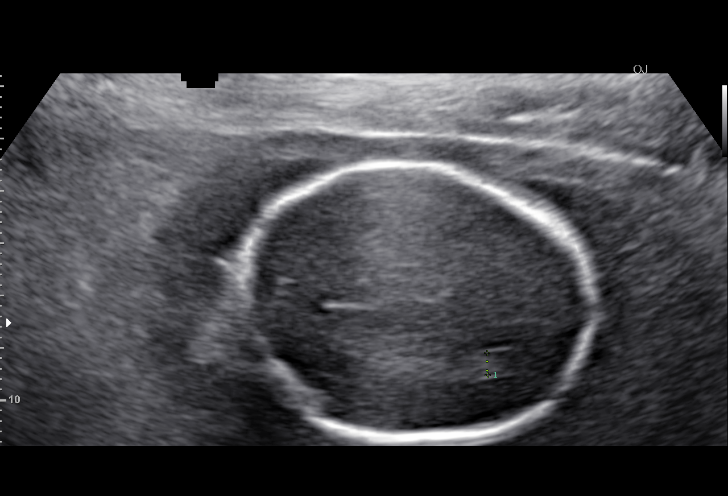
[im 40/75]
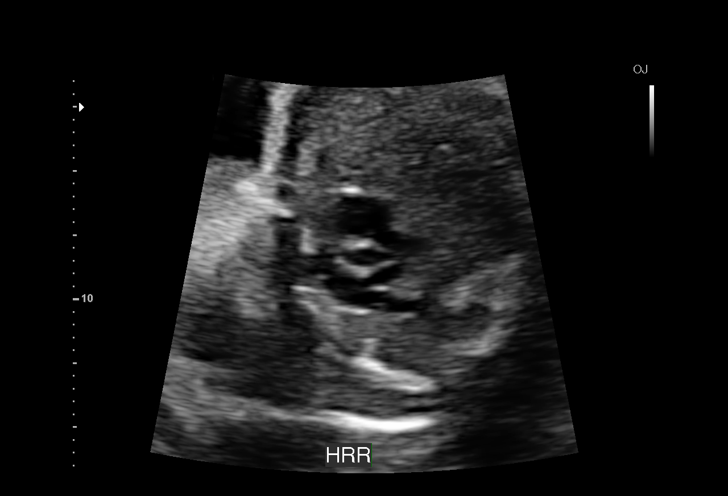
[im 48/75]
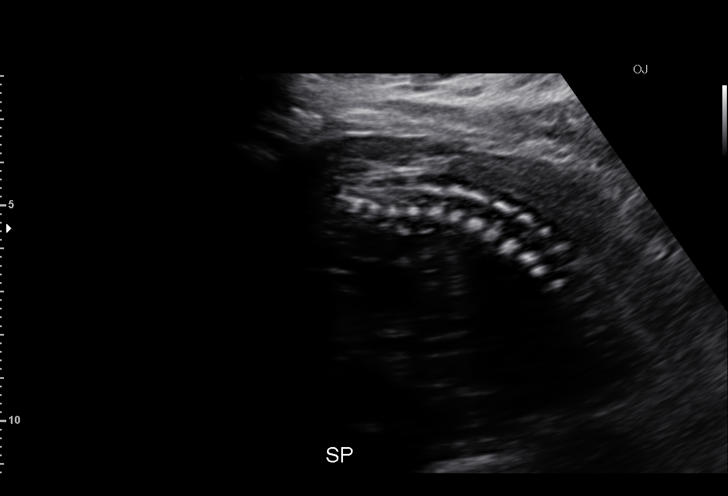
[im 57/75]
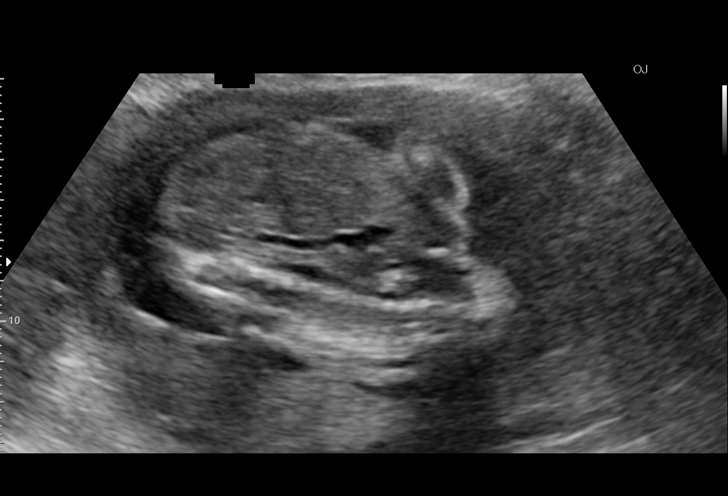
[im 66/75]
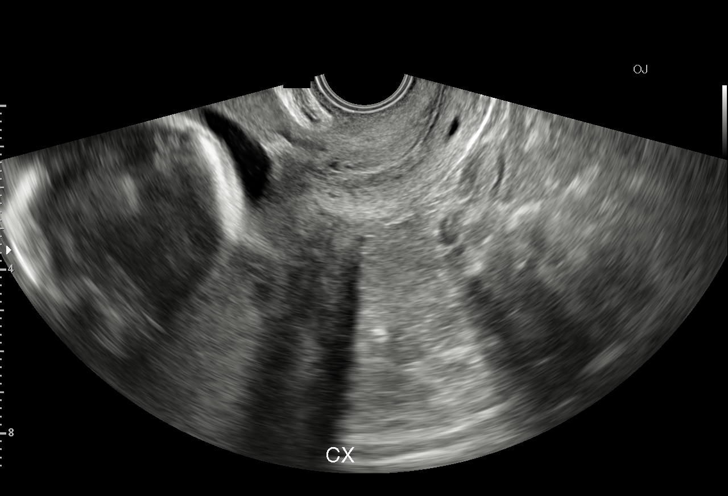
[im 75/75]
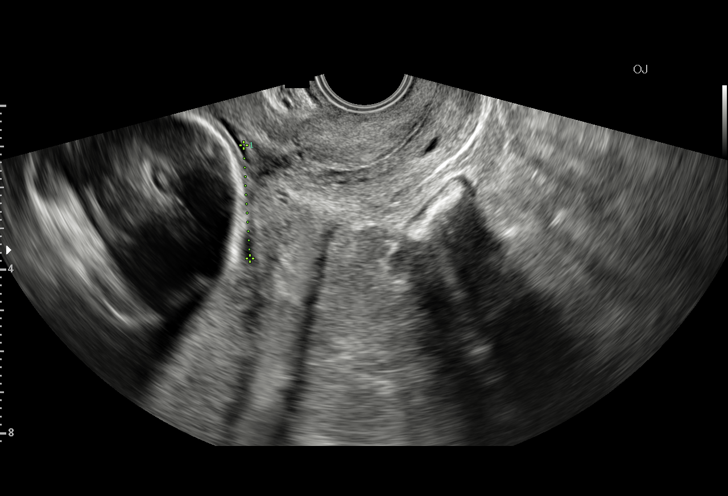

[Series 3: us mfm ob transvaginal · 39 acquisitions, 5 frames shown (2 of 2)]
[im 5/39]
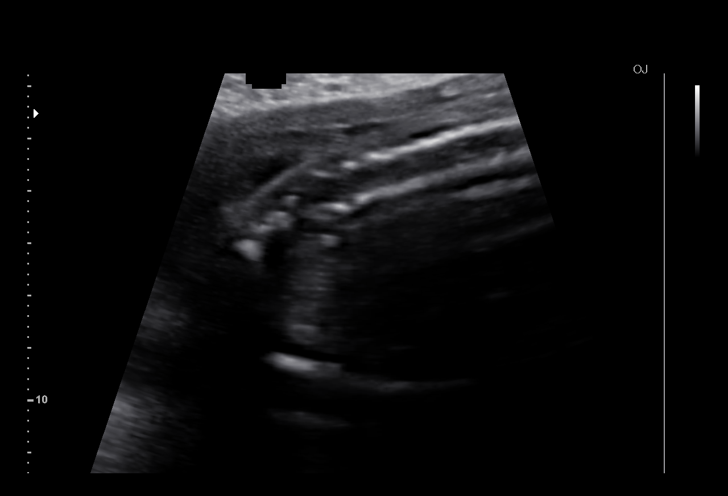
[im 13/39]
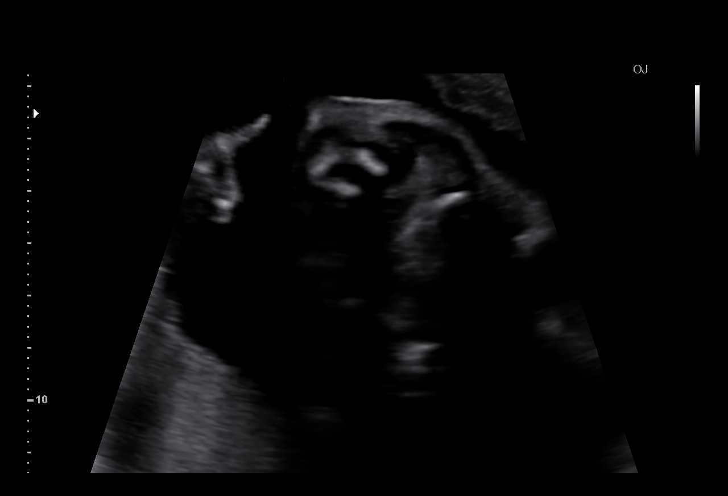
[im 22/39]
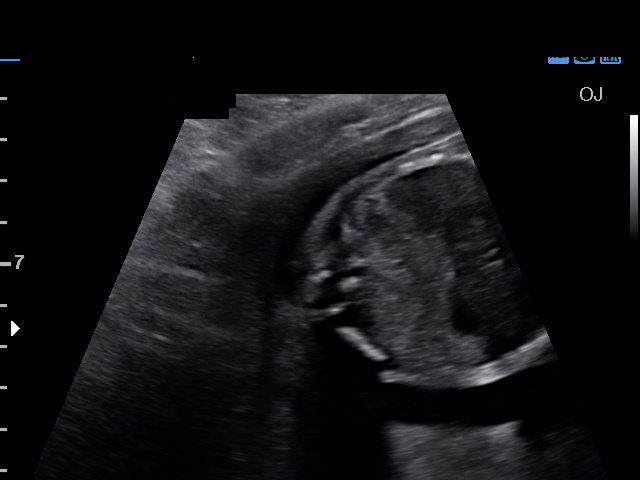
[im 30/39]
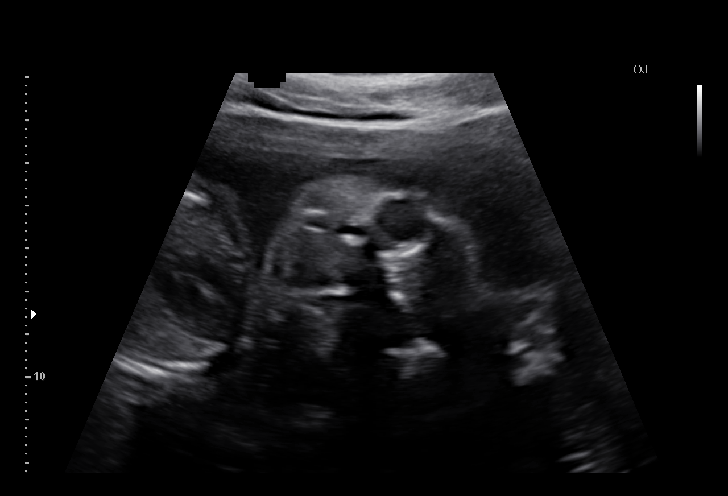
[im 39/39]
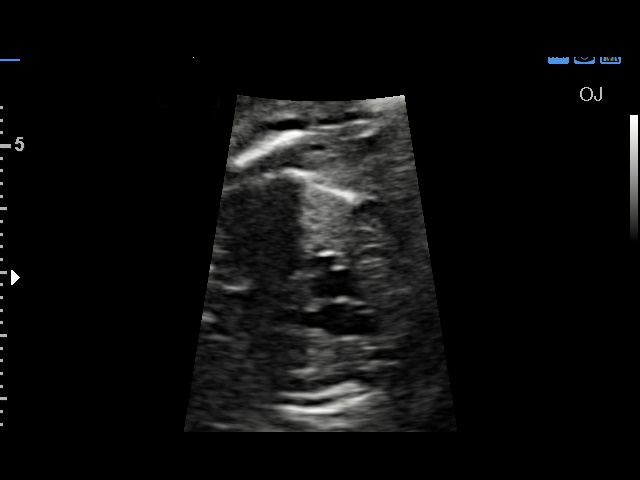

[14 of 28 positions shown; findings below may reference images not displayed]

am)

Obstetrics

1  SAINTILUS ROSSA           [id number]      [id number]     [id number]
2  SAINTILUS ROSSA           [id number]      [id number]     [id number]
Indications

22 weeks gestation of pregnancy
Detailed fetal anatomic survey                 Z36
Abnormal fetal ultrasound (2VC, RLQ cyst)
Maternal morbid obesity
Medical complication of pregnancy (hx PE
6839)
Advanced maternal age multigravida
(37)second trimester -low risk NIPS
OB History

Height:       5'6"    Weight:   319        BMI:
Gravidity:    1         Term:   0        Prem:   0        SAB:   0
TOP:          0       Ectopic:  0        Living: 0
Fetal Evaluation

Num Of Fetuses:     1
Fetal Heart         151
Rate(bpm):
Cardiac Activity:   Observed
Presentation:       Breech
Placenta:           Posterior, above cervical os

Amniotic Fluid
AFI FV:      Subjectively within normal limits
Biometry

BPD:      52.6  mm     G. Age:  22w 0d                  CI:        73.39   %    70 - 86
FL/HC:      21.4   %    18.4 -
HC:      195.1  mm     G. Age:  21w 5d         27  %    HC/AC:      1.05        1.06 -
AC:      185.1  mm     G. Age:  23w 2d         81  %    FL/BPD:     79.3   %    71 - 87
FL:       41.7  mm     G. Age:  23w 4d         86  %    FL/AC:      22.5   %    20 - 24
HUM:      36.5  mm     G. Age:  22w 5d         67  %

Est. FW:     571  gm      1 lb 4 oz     65  %
Gestational Age

LMP:           22w 0d        Date:  04/25/15                 EDD:   01/30/16
U/S Today:     22w 5d                                        EDD:   01/25/16
Best:          22w 0d     Det. By:  LMP  (04/25/15)          EDD:   01/30/16
Anatomy

Cranium:          Appears normal         Aortic Arch:      Appears normal
Fetal Cavum:      Appears normal         Ductal Arch:      Not well visualized
Ventricles:       Appears normal         Diaphragm:        Appears normal
Choroid Plexus:   Appears normal         Stomach:          Appears normal, left
sided
Cerebellum:       Appears normal         Abdomen:          Cyst on pelvis
Posterior Fossa:  Appears normal         Abdominal Wall:   Not well visualized
Nuchal Fold:      Not applicable (>20    Cord Vessels:     Appears normal (3
wks GA)                                  vessel cord)
Face:             Not well visualized    Kidneys:          Appear normal
Lips:             Not well visualized    Bladder:          Appears normal
Fetal Thoracic:   Appears normal         Spine:            Not well visualized
Heart:            Not well visualized    Upper             Appears normal
Extremities:
RVOT:             Appears normal         Lower             Appears normal
Extremities:
LVOT:             Appears normal

Other:  Female gender. Technically difficult due to maternal habitus and fetal
position.
Cervix Uterus Adnexa

Cervix
Length:              3  cm.
Normal appearance by transvaginal scan
Impression

SIUP at 22+0 weeks
Right pelvic cyst - most c/w ovarian cyst
All other detailed fetal anatomy was seen and appeared
normal; limited views of face, 4-ch, DA, CI and spine
Normal amniotic fluid volume
Measurements consistent with LMP dating; EFW at the 65th
%tile
EV views of cervix: normal length without funneling
Posterior placenta

The US findings were shared with Ms. Tempest. The
implications of an ovarian cyst were discussed in detail.
Recommendations

Follow-up ultrasound in 4-6 weeks to reassess cyst and to
complete anatomic survey
We would be happy to perform this exam.  If desired, please
call to schedule.
Attending Physician, TIGER

## 2017-09-28 DIAGNOSIS — J0140 Acute pansinusitis, unspecified: Secondary | ICD-10-CM | POA: Diagnosis not present

## 2017-10-23 DIAGNOSIS — Z131 Encounter for screening for diabetes mellitus: Secondary | ICD-10-CM | POA: Diagnosis not present

## 2017-10-23 DIAGNOSIS — H9203 Otalgia, bilateral: Secondary | ICD-10-CM | POA: Diagnosis not present

## 2017-11-02 DIAGNOSIS — R079 Chest pain, unspecified: Secondary | ICD-10-CM | POA: Diagnosis not present

## 2017-11-02 DIAGNOSIS — R918 Other nonspecific abnormal finding of lung field: Secondary | ICD-10-CM | POA: Diagnosis not present

## 2017-11-02 DIAGNOSIS — K760 Fatty (change of) liver, not elsewhere classified: Secondary | ICD-10-CM | POA: Diagnosis not present

## 2017-11-02 DIAGNOSIS — K219 Gastro-esophageal reflux disease without esophagitis: Secondary | ICD-10-CM | POA: Diagnosis not present

## 2017-11-02 DIAGNOSIS — K210 Gastro-esophageal reflux disease with esophagitis: Secondary | ICD-10-CM | POA: Diagnosis not present

## 2017-11-02 DIAGNOSIS — Z86711 Personal history of pulmonary embolism: Secondary | ICD-10-CM | POA: Diagnosis not present

## 2017-11-02 DIAGNOSIS — R1013 Epigastric pain: Secondary | ICD-10-CM | POA: Diagnosis not present

## 2017-11-02 DIAGNOSIS — R0789 Other chest pain: Secondary | ICD-10-CM | POA: Diagnosis not present

## 2017-11-02 DIAGNOSIS — Z86718 Personal history of other venous thrombosis and embolism: Secondary | ICD-10-CM | POA: Diagnosis not present

## 2017-11-05 DIAGNOSIS — K219 Gastro-esophageal reflux disease without esophagitis: Secondary | ICD-10-CM | POA: Diagnosis not present

## 2017-11-05 DIAGNOSIS — R1012 Left upper quadrant pain: Secondary | ICD-10-CM | POA: Diagnosis not present

## 2017-11-05 DIAGNOSIS — R110 Nausea: Secondary | ICD-10-CM | POA: Diagnosis not present

## 2017-11-05 DIAGNOSIS — R1013 Epigastric pain: Secondary | ICD-10-CM | POA: Diagnosis not present

## 2017-11-17 DIAGNOSIS — R079 Chest pain, unspecified: Secondary | ICD-10-CM | POA: Diagnosis not present

## 2017-11-17 DIAGNOSIS — R110 Nausea: Secondary | ICD-10-CM | POA: Diagnosis not present

## 2017-11-17 DIAGNOSIS — R1012 Left upper quadrant pain: Secondary | ICD-10-CM | POA: Diagnosis not present

## 2017-11-17 DIAGNOSIS — R1013 Epigastric pain: Secondary | ICD-10-CM | POA: Diagnosis not present

## 2017-11-17 DIAGNOSIS — K219 Gastro-esophageal reflux disease without esophagitis: Secondary | ICD-10-CM | POA: Diagnosis not present

## 2017-11-30 DIAGNOSIS — Z209 Contact with and (suspected) exposure to unspecified communicable disease: Secondary | ICD-10-CM | POA: Diagnosis not present

## 2017-12-04 DIAGNOSIS — K219 Gastro-esophageal reflux disease without esophagitis: Secondary | ICD-10-CM | POA: Diagnosis not present

## 2017-12-11 DIAGNOSIS — K219 Gastro-esophageal reflux disease without esophagitis: Secondary | ICD-10-CM | POA: Diagnosis not present

## 2017-12-11 DIAGNOSIS — R079 Chest pain, unspecified: Secondary | ICD-10-CM | POA: Diagnosis not present

## 2017-12-11 DIAGNOSIS — R1012 Left upper quadrant pain: Secondary | ICD-10-CM | POA: Diagnosis not present

## 2017-12-11 DIAGNOSIS — R1013 Epigastric pain: Secondary | ICD-10-CM | POA: Diagnosis not present

## 2017-12-21 DIAGNOSIS — Z86718 Personal history of other venous thrombosis and embolism: Secondary | ICD-10-CM | POA: Diagnosis not present

## 2017-12-21 DIAGNOSIS — Z6841 Body Mass Index (BMI) 40.0 and over, adult: Secondary | ICD-10-CM | POA: Diagnosis not present

## 2017-12-21 DIAGNOSIS — K219 Gastro-esophageal reflux disease without esophagitis: Secondary | ICD-10-CM | POA: Diagnosis not present

## 2017-12-25 DIAGNOSIS — F54 Psychological and behavioral factors associated with disorders or diseases classified elsewhere: Secondary | ICD-10-CM | POA: Diagnosis not present

## 2017-12-25 DIAGNOSIS — Z7189 Other specified counseling: Secondary | ICD-10-CM | POA: Diagnosis not present

## 2017-12-25 DIAGNOSIS — Z6841 Body Mass Index (BMI) 40.0 and over, adult: Secondary | ICD-10-CM | POA: Diagnosis not present

## 2018-01-14 ENCOUNTER — Encounter: Payer: Self-pay | Admitting: Obstetrics & Gynecology

## 2018-01-14 ENCOUNTER — Other Ambulatory Visit: Payer: Self-pay | Admitting: Obstetrics & Gynecology

## 2018-01-14 ENCOUNTER — Ambulatory Visit (INDEPENDENT_AMBULATORY_CARE_PROVIDER_SITE_OTHER): Payer: BLUE CROSS/BLUE SHIELD | Admitting: Obstetrics & Gynecology

## 2018-01-14 VITALS — BP 138/92 | Ht 66.0 in | Wt 323.0 lb

## 2018-01-14 DIAGNOSIS — R030 Elevated blood-pressure reading, without diagnosis of hypertension: Secondary | ICD-10-CM | POA: Diagnosis not present

## 2018-01-14 DIAGNOSIS — Z975 Presence of (intrauterine) contraceptive device: Secondary | ICD-10-CM

## 2018-01-14 DIAGNOSIS — Z30431 Encounter for routine checking of intrauterine contraceptive device: Secondary | ICD-10-CM

## 2018-01-14 DIAGNOSIS — Z01419 Encounter for gynecological examination (general) (routine) without abnormal findings: Secondary | ICD-10-CM

## 2018-01-14 DIAGNOSIS — M25571 Pain in right ankle and joints of right foot: Secondary | ICD-10-CM | POA: Diagnosis not present

## 2018-01-14 DIAGNOSIS — N921 Excessive and frequent menstruation with irregular cycle: Secondary | ICD-10-CM | POA: Diagnosis not present

## 2018-01-14 DIAGNOSIS — Z01818 Encounter for other preprocedural examination: Secondary | ICD-10-CM | POA: Diagnosis not present

## 2018-01-14 DIAGNOSIS — Z6841 Body Mass Index (BMI) 40.0 and over, adult: Secondary | ICD-10-CM | POA: Diagnosis not present

## 2018-01-14 DIAGNOSIS — Z1231 Encounter for screening mammogram for malignant neoplasm of breast: Secondary | ICD-10-CM

## 2018-01-14 NOTE — Progress Notes (Signed)
Betty Perkins [birth year] [id number]   History:    42 y.o. G1P1L1 Married.  Daughter is 2 yo.  RP:  Established patient presenting for annual gyn exam   HPI: [name] IUD x 01/2017.  Menses are normal flow, but frequent, almost every day BTB. H/O Endometrial Polyps (5) excised by HSC.  H/O PE.  No pelvic pain.  No pain with IC.  Urine/BMs wnl.    Past medical history,surgical history, family history and social history were all reviewed and documented in the EPIC chart.  Gynecologic History No LMP recorded. (Menstrual status: IUD). Contraception: [name] IUD x 01/2017 Last Pap: 11/2014. Results were: Negative Last mammogram: Never Bone Density: Never Colonoscopy: Never  Obstetric History OB History  Gravida Para Term Preterm AB Living  0 0 1  SAB TAB Ectopic Multiple Live Births  0 0 0 0 1    # Outcome Date GA Lbr Len/2nd Weight Sex Delivery Anes PTL Lv  1 Term 01/31/16 [redacted]w[redacted]d  7 lb 5.6 oz (3.335 kg) F CS-LTranv EPI  LIV     ROS: A ROS was performed and pertinent positives and negatives are included in the history.  GENERAL: No fevers or chills. HEENT: No change in vision, no earache, sore throat or sinus congestion. NECK: No pain or stiffness. CARDIOVASCULAR: No chest pain or pressure. No palpitations. PULMONARY: No shortness of breath, cough or wheeze. GASTROINTESTINAL: No abdominal pain, nausea, vomiting or diarrhea, melena or bright red blood per rectum. GENITOURINARY: No urinary frequency, urgency, hesitancy or dysuria. MUSCULOSKELETAL: No joint or muscle pain, no back pain, no recent trauma. DERMATOLOGIC: No rash, no itching, no lesions. ENDOCRINE: No polyuria, polydipsia, no heat or cold intolerance. No recent change in weight. HEMATOLOGICAL: No anemia or easy bruising or bleeding. NEUROLOGIC: No headache, seizures, numbness, tingling or weakness. PSYCHIATRIC: No depression, no loss of interest in normal activity or change in sleep pattern.     Exam:   BP (!)  138/92   Ht  (1.676 m)   Wt (!) 323 lb (146.5 kg)   BMI 52.13 kg/m   Body mass index is 52.13 kg/m.  General appearance : Well developed well nourished female. No acute distress HEENT: Eyes: no retinal hemorrhage or exudates,  Neck supple, trachea midline, no carotid bruits, no thyroidmegaly Lungs: Clear to auscultation, no rhonchi or wheezes, or rib retractions  Heart: Regular rate and rhythm, no murmurs or gallops Breast:Examined in sitting and supine position were symmetrical in appearance, no palpable masses or tenderness,  no skin retraction, no nipple inversion, no nipple discharge, no skin discoloration, no axillary or supraclavicular lymphadenopathy Abdomen: no palpable masses or tenderness, no rebound or guarding Extremities: no edema or skin discoloration or tenderness  Pelvic: Vulva: Normal             Vagina: No gross lesions or discharge  Cervix: No gross lesions or discharge.  IUD strings visible. Pap reflex done.  Uterus  AV, normal size, shape and consistency, non-tender and mobile  Adnexa  Without masses or tenderness  Anus: Normal   Assessment/Plan:  42 y.o. female for annual exam   1. Encounter for routine gynecological examination with Papanicolaou smear of cervix Normal gynecologic exam.  Pap reflex done.  Breast exam normal.  Patient is now 16, will schedule her first screening mammogram.  Health labs with family physician.  Recommend regular physical activity. - Pap IG w/ reflex to HPV when ASC-U  2. Encounter for routine checking of  intrauterine contraceptive device (IUD) [name] IUD well-tolerated and in good position.  History of pulmonary embolism.  Patient with breakthrough bleeding recently, will follow up for a pelvic ultrasound.    3. Breakthrough bleeding with IUD Breakthrough bleeding with [name] IUD, follow-up with a pelvic ultrasound to rule out uterine or endometrial pathology. - US Transvaginal Non-OB; Future  4. Class 3 severe obesity  due to excess calories without serious comorbidity with body mass index (BMI) of 50.0 to 59.9 in adult [facility]) Low calorie/low carb diet such as [employer] recommended.  Regular physical activity with aerobic activities 5 times a week and weight lifting every 2 days recommended.  [name], 2:22 PM 01/14/2018

## 2018-01-15 LAB — PAP IG W/ RFLX HPV ASCU

## 2018-01-17 ENCOUNTER — Encounter: Payer: Self-pay | Admitting: Obstetrics & Gynecology

## 2018-01-17 NOTE — Patient Instructions (Addendum)
1. Encounter for routine gynecological examination with Papanicolaou smear of cervix Normal gynecologic exam.  Pap reflex done.  Breast exam normal.  Patient is now 3, will schedule her first screening mammogram.  Health labs with family physician.  Recommend regular physical activity. - Pap IG w/ reflex to HPV when ASC-U  2. Encounter for routine checking of intrauterine contraceptive device (IUD) [name] IUD well-tolerated and in good position.  History of pulmonary embolism.  Patient with breakthrough bleeding recently, will follow up for a pelvic ultrasound.    3. Breakthrough bleeding with IUD Breakthrough bleeding with [name] IUD, follow-up with a pelvic ultrasound to rule out uterine or endometrial pathology. - US Transvaginal Non-OB; Future  4. Class 3 severe obesity due to excess calories without serious comorbidity with body mass index (BMI) of 50.0 to 59.9 in adult [facility]) Low calorie/low carb diet such as [employer] recommended.  Regular physical activity with aerobic activities 5 times a week and weight lifting every 2 days recommended.  Victorino Dike, good seeing you today!  I will inform you of your results and we will see you soon for the pelvic ultrasound.

## 2018-01-19 DIAGNOSIS — Z713 Dietary counseling and surveillance: Secondary | ICD-10-CM | POA: Diagnosis not present

## 2018-01-19 DIAGNOSIS — Z6841 Body Mass Index (BMI) 40.0 and over, adult: Secondary | ICD-10-CM | POA: Diagnosis not present

## 2018-01-22 DIAGNOSIS — Z86718 Personal history of other venous thrombosis and embolism: Secondary | ICD-10-CM | POA: Diagnosis not present

## 2018-01-22 DIAGNOSIS — K219 Gastro-esophageal reflux disease without esophagitis: Secondary | ICD-10-CM | POA: Diagnosis not present

## 2018-01-22 DIAGNOSIS — I10 Essential (primary) hypertension: Secondary | ICD-10-CM | POA: Diagnosis not present

## 2018-01-22 DIAGNOSIS — Z6841 Body Mass Index (BMI) 40.0 and over, adult: Secondary | ICD-10-CM | POA: Diagnosis not present

## 2018-01-29 DIAGNOSIS — I10 Essential (primary) hypertension: Secondary | ICD-10-CM | POA: Diagnosis not present

## 2018-02-15 ENCOUNTER — Encounter: Payer: Self-pay | Admitting: Obstetrics & Gynecology

## 2018-02-15 ENCOUNTER — Ambulatory Visit
Admission: RE | Admit: 2018-02-15 | Discharge: 2018-02-15 | Disposition: A | Discharge status: Home / Self Care | Payer: BLUE CROSS/BLUE SHIELD | Source: Ambulatory Visit | Attending: Obstetrics & Gynecology | Admitting: Obstetrics & Gynecology

## 2018-02-15 ENCOUNTER — Ambulatory Visit (INDEPENDENT_AMBULATORY_CARE_PROVIDER_SITE_OTHER): Payer: BLUE CROSS/BLUE SHIELD

## 2018-02-15 ENCOUNTER — Ambulatory Visit (INDEPENDENT_AMBULATORY_CARE_PROVIDER_SITE_OTHER): Payer: BLUE CROSS/BLUE SHIELD | Admitting: Obstetrics & Gynecology

## 2018-02-15 VITALS — BP 128/86

## 2018-02-15 DIAGNOSIS — Z975 Presence of (intrauterine) contraceptive device: Secondary | ICD-10-CM | POA: Diagnosis not present

## 2018-02-15 DIAGNOSIS — Z1231 Encounter for screening mammogram for malignant neoplasm of breast: Secondary | ICD-10-CM

## 2018-02-15 DIAGNOSIS — N921 Excessive and frequent menstruation with irregular cycle: Secondary | ICD-10-CM

## 2018-02-15 MED ORDER — NORETHINDRONE 0.35 MG PO TABS: 1.0000 | ORAL_TABLET | Freq: Every day | ORAL | 1 refills | Status: DC

## 2018-02-15 NOTE — Progress Notes (Signed)
    Skeet LatchJennifer L Lott 1976/04/14 [id number]        42 y.o.  G1P1001   RP: Breakthrough bleeding on Mirena IUD for pelvic ultrasound.  HPI: Breakthrough bleeding on Mirena IUD.  No pelvic pain.  No abnormal vaginal discharge otherwise.  Urine and bowel movements normal.  No fever.   OB History  Gravida Para Term Preterm AB Living  1 1 1  0 0 1  SAB TAB Ectopic Multiple Live Births  0 0 0 0 1    # Outcome Date GA Lbr Len/2nd Weight Sex Delivery Anes PTL Lv  1 Term 01/31/16 7130w1d  7 lb 5.6 oz (3.335 kg) F CS-LTranv EPI  LIV    Past medical history,surgical history, problem list, medications, allergies, family history and social history were all reviewed and documented in the EPIC chart.   Directed ROS with pertinent positives and negatives documented in the history of present illness/assessment and plan.  Exam:  Vitals:   02/15/18 1521  BP: 128/86   General appearance:  Normal  Pelvic US today: T/V images.  Uterus anteverted homogeneous measuring 8.36 x 5.26 x 3.87 cm.  Endometrial line tri-layered, thin at 5.6 mm.  Intra-uterine device seen in normal intrauterine position.  No defect seen in intra-uterine cavity.  Right and left ovaries normal.  No apparent mass in the right and left adnexa.  No free fluid in the posterior cul-de-sac.   Assessment/Plan:  42 y.o. G1P1001   1. Breakthrough bleeding associated with intrauterine device (IUD) Normal endometrial line on ultrasound today measured at 5.6 mm.  IUD in good normal intrauterine position.  No defect seen in the intrauterine cavity.  Patient reassured.  Will add progestin-only pill to try to control the breakthrough bleeding for up to 3 packs.  Other orders - norethindrone (MICRONOR,CAMILA,ERRIN) 0.35 MG tablet; Take 1 tablet (0.35 mg total) by mouth daily.  Counseling on above issues and coordination of care more than 50% for 15 minutes.  Genia DelMarie-Lyne Jarnell Cordaro MD, 3:30 PM 02/15/2018

## 2018-02-18 ENCOUNTER — Encounter: Payer: Self-pay | Admitting: Obstetrics & Gynecology

## 2018-02-18 NOTE — Patient Instructions (Signed)
1. Breakthrough bleeding associated with intrauterine device (IUD) Normal endometrial line on ultrasound today measured at 5.6 mm.  IUD in good normal intrauterine position.  No defect seen in the intrauterine cavity.  Patient reassured.  Will add progestin-only pill to try to control the breakthrough bleeding for up to 3 packs.  Other orders - norethindrone (MICRONOR,CAMILA,ERRIN) 0.35 MG tablet; Take 1 tablet (0.35 mg total) by mouth daily.  Victorino DikeJennifer, good seeing you today!

## 2018-02-23 DIAGNOSIS — Z131 Encounter for screening for diabetes mellitus: Secondary | ICD-10-CM | POA: Diagnosis not present

## 2018-02-23 DIAGNOSIS — R635 Abnormal weight gain: Secondary | ICD-10-CM | POA: Diagnosis not present

## 2018-02-23 DIAGNOSIS — Z13220 Encounter for screening for lipoid disorders: Secondary | ICD-10-CM | POA: Diagnosis not present

## 2018-02-23 DIAGNOSIS — E559 Vitamin D deficiency, unspecified: Secondary | ICD-10-CM | POA: Diagnosis not present

## 2018-02-23 DIAGNOSIS — R5383 Other fatigue: Secondary | ICD-10-CM | POA: Diagnosis not present

## 2018-02-26 DIAGNOSIS — I10 Essential (primary) hypertension: Secondary | ICD-10-CM | POA: Diagnosis not present

## 2018-03-23 DIAGNOSIS — Z713 Dietary counseling and surveillance: Secondary | ICD-10-CM | POA: Diagnosis not present

## 2018-03-23 DIAGNOSIS — Z6841 Body Mass Index (BMI) 40.0 and over, adult: Secondary | ICD-10-CM | POA: Diagnosis not present

## 2018-04-08 DIAGNOSIS — Z0184 Encounter for antibody response examination: Secondary | ICD-10-CM | POA: Diagnosis not present

## 2018-04-26 DIAGNOSIS — K219 Gastro-esophageal reflux disease without esophagitis: Secondary | ICD-10-CM | POA: Diagnosis not present

## 2018-04-26 DIAGNOSIS — F54 Psychological and behavioral factors associated with disorders or diseases classified elsewhere: Secondary | ICD-10-CM | POA: Diagnosis not present

## 2018-04-26 DIAGNOSIS — Z6841 Body Mass Index (BMI) 40.0 and over, adult: Secondary | ICD-10-CM | POA: Diagnosis not present

## 2018-04-27 DIAGNOSIS — Z713 Dietary counseling and surveillance: Secondary | ICD-10-CM | POA: Diagnosis not present

## 2018-05-10 DIAGNOSIS — Z7189 Other specified counseling: Secondary | ICD-10-CM | POA: Diagnosis not present

## 2018-05-10 DIAGNOSIS — F54 Psychological and behavioral factors associated with disorders or diseases classified elsewhere: Secondary | ICD-10-CM | POA: Diagnosis not present

## 2018-05-10 DIAGNOSIS — Z6841 Body Mass Index (BMI) 40.0 and over, adult: Secondary | ICD-10-CM | POA: Diagnosis not present

## 2018-05-20 DIAGNOSIS — Z713 Dietary counseling and surveillance: Secondary | ICD-10-CM | POA: Diagnosis not present

## 2018-06-10 DIAGNOSIS — F54 Psychological and behavioral factors associated with disorders or diseases classified elsewhere: Secondary | ICD-10-CM | POA: Diagnosis not present

## 2018-06-10 DIAGNOSIS — Z7189 Other specified counseling: Secondary | ICD-10-CM | POA: Diagnosis not present

## 2018-07-23 DIAGNOSIS — Z86718 Personal history of other venous thrombosis and embolism: Secondary | ICD-10-CM | POA: Diagnosis not present

## 2018-07-23 DIAGNOSIS — K219 Gastro-esophageal reflux disease without esophagitis: Secondary | ICD-10-CM | POA: Diagnosis not present

## 2018-07-27 DIAGNOSIS — Z01818 Encounter for other preprocedural examination: Secondary | ICD-10-CM | POA: Diagnosis not present

## 2018-08-02 DIAGNOSIS — M549 Dorsalgia, unspecified: Secondary | ICD-10-CM | POA: Diagnosis not present

## 2018-08-02 DIAGNOSIS — Z881 Allergy status to other antibiotic agents status: Secondary | ICD-10-CM | POA: Diagnosis not present

## 2018-08-02 DIAGNOSIS — Z87442 Personal history of urinary calculi: Secondary | ICD-10-CM | POA: Diagnosis not present

## 2018-08-02 DIAGNOSIS — K219 Gastro-esophageal reflux disease without esophagitis: Secondary | ICD-10-CM | POA: Diagnosis not present

## 2018-08-02 DIAGNOSIS — Z87891 Personal history of nicotine dependence: Secondary | ICD-10-CM | POA: Diagnosis not present

## 2018-08-02 DIAGNOSIS — E559 Vitamin D deficiency, unspecified: Secondary | ICD-10-CM | POA: Diagnosis not present

## 2018-08-02 DIAGNOSIS — E8881 Metabolic syndrome: Secondary | ICD-10-CM | POA: Diagnosis not present

## 2018-08-02 DIAGNOSIS — R5383 Other fatigue: Secondary | ICD-10-CM | POA: Diagnosis not present

## 2018-08-02 DIAGNOSIS — M2550 Pain in unspecified joint: Secondary | ICD-10-CM | POA: Diagnosis not present

## 2018-08-02 DIAGNOSIS — Z6841 Body Mass Index (BMI) 40.0 and over, adult: Secondary | ICD-10-CM | POA: Diagnosis not present

## 2018-08-02 DIAGNOSIS — Z975 Presence of (intrauterine) contraceptive device: Secondary | ICD-10-CM | POA: Diagnosis not present

## 2018-08-02 DIAGNOSIS — Z86711 Personal history of pulmonary embolism: Secondary | ICD-10-CM | POA: Diagnosis not present

## 2018-08-02 DIAGNOSIS — Z809 Family history of malignant neoplasm, unspecified: Secondary | ICD-10-CM | POA: Diagnosis not present

## 2018-08-02 DIAGNOSIS — Z86718 Personal history of other venous thrombosis and embolism: Secondary | ICD-10-CM | POA: Diagnosis not present

## 2018-08-02 DIAGNOSIS — Z79899 Other long term (current) drug therapy: Secondary | ICD-10-CM | POA: Diagnosis not present

## 2018-08-19 DIAGNOSIS — Z713 Dietary counseling and surveillance: Secondary | ICD-10-CM | POA: Diagnosis not present

## 2018-08-26 ENCOUNTER — Other Ambulatory Visit: Payer: Self-pay | Admitting: Family Medicine

## 2018-08-26 ENCOUNTER — Ambulatory Visit
Admission: RE | Admit: 2018-08-26 | Discharge: 2018-08-26 | Disposition: A | Discharge status: Home / Self Care | Payer: BLUE CROSS/BLUE SHIELD | Source: Ambulatory Visit | Attending: Family Medicine | Admitting: Family Medicine

## 2018-08-26 DIAGNOSIS — Z86711 Personal history of pulmonary embolism: Secondary | ICD-10-CM | POA: Diagnosis not present

## 2018-08-26 DIAGNOSIS — R0609 Other forms of dyspnea: Principal | ICD-10-CM

## 2018-08-26 DIAGNOSIS — R079 Chest pain, unspecified: Secondary | ICD-10-CM | POA: Diagnosis not present

## 2018-08-26 DIAGNOSIS — I10 Essential (primary) hypertension: Secondary | ICD-10-CM | POA: Diagnosis not present

## 2018-08-26 DIAGNOSIS — R0600 Dyspnea, unspecified: Secondary | ICD-10-CM

## 2018-08-26 MED ORDER — IOPAMIDOL (ISOVUE-370) INJECTION 76%
80.0000 mL | Freq: Once | INTRAVENOUS | Status: AC | PRN
  Administered 2018-08-26: 80 mL via INTRAVENOUS

## 2018-08-30 DIAGNOSIS — R112 Nausea with vomiting, unspecified: Secondary | ICD-10-CM | POA: Diagnosis not present

## 2018-08-30 DIAGNOSIS — Z9889 Other specified postprocedural states: Secondary | ICD-10-CM | POA: Diagnosis not present

## 2018-11-01 DIAGNOSIS — Z713 Dietary counseling and surveillance: Secondary | ICD-10-CM | POA: Diagnosis not present

## 2019-01-10 ENCOUNTER — Other Ambulatory Visit: Payer: Self-pay | Admitting: Obstetrics & Gynecology

## 2019-01-10 DIAGNOSIS — Z1231 Encounter for screening mammogram for malignant neoplasm of breast: Secondary | ICD-10-CM

## 2019-01-17 ENCOUNTER — Encounter: Payer: BLUE CROSS/BLUE SHIELD | Admitting: Obstetrics & Gynecology

## 2019-01-19 ENCOUNTER — Other Ambulatory Visit: Payer: Self-pay

## 2019-01-20 ENCOUNTER — Ambulatory Visit (INDEPENDENT_AMBULATORY_CARE_PROVIDER_SITE_OTHER): Payer: BLUE CROSS/BLUE SHIELD | Admitting: Obstetrics & Gynecology

## 2019-01-20 ENCOUNTER — Encounter: Payer: Self-pay | Admitting: Obstetrics & Gynecology

## 2019-01-20 VITALS — BP 122/76 | Ht 66.0 in | Wt 242.0 lb

## 2019-01-20 DIAGNOSIS — Z01419 Encounter for gynecological examination (general) (routine) without abnormal findings: Secondary | ICD-10-CM

## 2019-01-20 DIAGNOSIS — Z30431 Encounter for routine checking of intrauterine contraceptive device: Secondary | ICD-10-CM | POA: Diagnosis not present

## 2019-01-20 DIAGNOSIS — Z9884 Bariatric surgery status: Secondary | ICD-10-CM | POA: Diagnosis not present

## 2019-01-20 NOTE — Progress Notes (Signed)
Betty Perkins 02-05-1976 [id number]   History:    43 y.o. G1P1L1 Married.  Daughter is 783 yo.  RP:  Established patient presenting for annual gyn exam   HPI: Well on Mirena IUD since May 2018.  No breakthrough bleeding.  No pelvic pain.  No pain with intercourse.  Normal vaginal secretions.  Urine and bowel movements normal.  Breasts normal.  Body mass index 39.06.  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  Has lost 65 pounds since then.  Doing well on a low calorie/carb diet currently with regular physical activities.  Health labs with family physician.  Past medical history,surgical history, family history and social history were all reviewed and documented in the EPIC chart.  Gynecologic History No LMP recorded. (Menstrual status: IUD). Contraception: Mirena IUD x 01/2017 Last Pap: 01/2018. Results were: Negative Last mammogram: 02/2018. Results were: Negative Bone Density: Never Colonoscopy: Never  Obstetric History OB History  Gravida Para Term Preterm AB Living  1 1 1  0 0 1  SAB TAB Ectopic Multiple Live Births  0 0 0 0 1    # Outcome Date GA Lbr Len/2nd Weight Sex Delivery Anes PTL Lv  1 Term 01/31/16 167w1d  7 lb 5.6 oz (3.335 kg) F CS-LTranv EPI  LIV     ROS: A ROS was performed and pertinent positives and negatives are included in the history.  GENERAL: No fevers or chills. HEENT: No change in vision, no earache, sore throat or sinus congestion. NECK: No pain or stiffness. CARDIOVASCULAR: No chest pain or pressure. No palpitations. PULMONARY: No shortness of breath, cough or wheeze. GASTROINTESTINAL: No abdominal pain, nausea, vomiting or diarrhea, melena or bright red blood per rectum. GENITOURINARY: No urinary frequency, urgency, hesitancy or dysuria. MUSCULOSKELETAL: No joint or muscle pain, no back pain, no recent trauma. DERMATOLOGIC: No rash, no itching, no lesions. ENDOCRINE: No polyuria, polydipsia, no heat or cold intolerance. No recent change in weight.  HEMATOLOGICAL: No anemia or easy bruising or bleeding. NEUROLOGIC: No headache, seizures, numbness, tingling or weakness. PSYCHIATRIC: No depression, no loss of interest in normal activity or change in sleep pattern.     Exam:   BP 122/76 (Cuff Size: Large)   Ht 5\' 6"  (1.676 m)   Wt 242 lb (109.8 kg)   BMI 39.06 kg/m   Body mass index is 39.06 kg/m.  General appearance : Well developed well nourished female. No acute distress HEENT: Eyes: no retinal hemorrhage or exudates,  Neck supple, trachea midline, no carotid bruits, no thyroidmegaly Lungs: Clear to auscultation, no rhonchi or wheezes, or rib retractions  Heart: Regular rate and rhythm, no murmurs or gallops Breast:Examined in sitting and supine position were symmetrical in appearance, no palpable masses or tenderness,  no skin retraction, no nipple inversion, no nipple discharge, no skin discoloration, no axillary or supraclavicular lymphadenopathy Abdomen: no palpable masses or tenderness, no rebound or guarding Extremities: no edema or skin discoloration or tenderness  Pelvic: Vulva: Normal             Vagina: No gross lesions or discharge  Cervix: No gross lesions or discharge.  IUD strings felt at exo-cervix.  Uterus  AV, normal size, shape and consistency, non-tender and mobile  Adnexa  Without masses or tenderness  Anus: Normal   Assessment/Plan:  43 y.o. female for annual exam   1. Well female exam with routine gynecological exam Normal gynecologic exam.  Pap test May 2019 was negative, no indication to repeat this year.  Breast exam normal.  Screening mammogram June 2019 was negative, patient will schedule repeat screening mammogram for June this year.  Health labs with family physician.  2. Encounter for routine checking of intrauterine contraceptive device (IUD) Mirena IUD well-tolerated since May 2018 and in good position.  Will continue with Mirena IUD.  3. Status post bariatric surgery Gastric sleeve in  November 2019 with no complication.  Patient successfully losing weight, 65 pound weight loss since November 2019.  Healthy nutrition and regular fitness activities.  Other orders - lansoprazole (PREVACID) 15 MG capsule; Take 15 mg by mouth 2 (two) times daily before a meal. - Calcium Carb-Cholecalciferol (CALCIUM 1000 + D PO); Take by mouth.  [name], 1:39 PM 01/20/2019

## 2019-01-21 ENCOUNTER — Encounter: Payer: Self-pay | Admitting: Obstetrics & Gynecology

## 2019-01-21 NOTE — Patient Instructions (Signed)
1. Well female exam with routine gynecological exam Normal gynecologic exam.  Pap test May 2019 was negative, no indication to repeat this year.  Breast exam normal.  Screening mammogram June 2019 was negative, patient will schedule repeat screening mammogram for June this year.  Health labs with family physician.  2. Encounter for routine checking of intrauterine contraceptive device (IUD) Mirena IUD well-tolerated since May 2018 and in good position.  Will continue with Mirena IUD.  3. Status post bariatric surgery Gastric sleeve in November 2019 with no complication.  Patient successfully losing weight, 65 pound weight loss since November 2019.  Healthy nutrition and regular fitness activities.  Other orders - lansoprazole (PREVACID) 15 MG capsule; Take 15 mg by mouth 2 (two) times daily before a meal. - Calcium Carb-Cholecalciferol (CALCIUM 1000 + D PO); Take by mouth.  Betty Perkins, it was a pleasure seeing you today!

## 2019-02-25 DIAGNOSIS — Z0000 Encounter for general adult medical examination without abnormal findings: Secondary | ICD-10-CM | POA: Diagnosis not present

## 2019-02-28 DIAGNOSIS — K912 Postsurgical malabsorption, not elsewhere classified: Secondary | ICD-10-CM | POA: Diagnosis not present

## 2019-02-28 DIAGNOSIS — Z903 Acquired absence of stomach [part of]: Secondary | ICD-10-CM | POA: Diagnosis not present

## 2019-02-28 DIAGNOSIS — Z713 Dietary counseling and surveillance: Secondary | ICD-10-CM | POA: Diagnosis not present

## 2019-03-10 ENCOUNTER — Other Ambulatory Visit: Payer: Self-pay

## 2019-03-10 ENCOUNTER — Ambulatory Visit
Admission: RE | Admit: 2019-03-10 | Discharge: 2019-03-10 | Disposition: A | Discharge status: Home / Self Care | Payer: BC Managed Care – PPO | Source: Ambulatory Visit | Attending: Obstetrics & Gynecology | Admitting: Obstetrics & Gynecology

## 2019-03-10 DIAGNOSIS — Z1231 Encounter for screening mammogram for malignant neoplasm of breast: Secondary | ICD-10-CM

## 2019-03-17 DIAGNOSIS — K912 Postsurgical malabsorption, not elsewhere classified: Secondary | ICD-10-CM | POA: Diagnosis not present

## 2019-03-17 DIAGNOSIS — Z903 Acquired absence of stomach [part of]: Secondary | ICD-10-CM | POA: Diagnosis not present

## 2019-03-28 DIAGNOSIS — Z111 Encounter for screening for respiratory tuberculosis: Secondary | ICD-10-CM | POA: Diagnosis not present

## 2019-06-27 DIAGNOSIS — R030 Elevated blood-pressure reading, without diagnosis of hypertension: Secondary | ICD-10-CM | POA: Diagnosis not present

## 2019-06-27 DIAGNOSIS — G5603 Carpal tunnel syndrome, bilateral upper limbs: Secondary | ICD-10-CM | POA: Diagnosis not present

## 2019-07-05 DIAGNOSIS — G5603 Carpal tunnel syndrome, bilateral upper limbs: Secondary | ICD-10-CM | POA: Diagnosis not present

## 2019-08-01 DIAGNOSIS — L209 Atopic dermatitis, unspecified: Secondary | ICD-10-CM | POA: Diagnosis not present

## 2019-09-26 DIAGNOSIS — K912 Postsurgical malabsorption, not elsewhere classified: Secondary | ICD-10-CM | POA: Diagnosis not present

## 2019-09-26 DIAGNOSIS — E559 Vitamin D deficiency, unspecified: Secondary | ICD-10-CM | POA: Diagnosis not present

## 2019-09-26 DIAGNOSIS — Z903 Acquired absence of stomach [part of]: Secondary | ICD-10-CM | POA: Diagnosis not present

## 2019-09-26 DIAGNOSIS — Z86718 Personal history of other venous thrombosis and embolism: Secondary | ICD-10-CM | POA: Diagnosis not present

## 2019-09-26 DIAGNOSIS — Z9884 Bariatric surgery status: Secondary | ICD-10-CM | POA: Diagnosis not present

## 2019-09-26 DIAGNOSIS — R519 Headache, unspecified: Secondary | ICD-10-CM | POA: Diagnosis not present

## 2019-10-06 DIAGNOSIS — Z903 Acquired absence of stomach [part of]: Secondary | ICD-10-CM | POA: Diagnosis not present

## 2019-10-06 DIAGNOSIS — K912 Postsurgical malabsorption, not elsewhere classified: Secondary | ICD-10-CM | POA: Diagnosis not present

## 2020-01-23 ENCOUNTER — Encounter: Payer: BLUE CROSS/BLUE SHIELD | Admitting: Obstetrics & Gynecology

## 2020-03-12 ENCOUNTER — Other Ambulatory Visit: Payer: Self-pay | Admitting: Urology

## 2020-03-12 DIAGNOSIS — N201 Calculus of ureter: Secondary | ICD-10-CM | POA: Diagnosis not present

## 2020-03-12 DIAGNOSIS — N202 Calculus of kidney with calculus of ureter: Secondary | ICD-10-CM | POA: Diagnosis not present

## 2020-03-13 NOTE — Progress Notes (Signed)
Patient to arrive at 1200 pm on 03/15/2020. Medications and history reviewed. No solids past MN, clear liquids until 7am. Amlodipine am of procedure. All instructions given. Driver secured.

## 2020-03-14 NOTE — H&P (Addendum)
I have ureteral stone.  HPI: Betty Perkins is a 44 year-old female patient who is here for ureteral stone.    Betty Perkins is a 44 yo female with a history stones who was last seen by [place] in 2008. She has had a few in the last several years. She was seen in the ER in [place] on 6/6 for right flank pain and then passed a stone on 6/9. She had the onset of left flank pain on 6/12 and she got relief until yesterday and began to have recurrent left flank pain. A CT on 02/19/20 showed a 4.72m left proximal stone and small renal stones on the right. She has had no hematuria. She some urgency and frequency today. She had back pain yesterday with suprapubic cramping. Her prior stones were CaOx.     ALLERGIES: No Allergies    MEDICATIONS: Amlodipine Besylate 5 mg tablet  Calcium + D3  Colace  Fish Oil  Mirena IUD  Multivitamin  Ondansetron Hcl 4 mg tablet  Prevacid 15 mg capsule,delayed release  Probiotic     GU PSH: None     PSH Notes: gastric sleeve 2019, rt ankle lt foot surgery   NON-GU PSH: Cesarean Delivery         GU PMH: Renal calculus      PMH Notes:  1898-09-15 00:00:00 - Note: Normal Routine History And Physical Adult, left leg DVT and pulmonary embolism ? due to birth control,     clotting factor V runs in family Pt states she has tested negative for it   NON-GU PMH: DVT, History GERD Hypercholesterolemia Hypertension Pulmonary Embolism, History    FAMILY HISTORY: Congenital Clotting Factor Deficiency - Brother, Mother, Cousin, Father, Runs in Family   SOCIAL HISTORY: Marital Status: Married Preferred Language: English; Race: White Current Smoking Status: Patient does not smoke anymore. Has not smoked since 02/13/2005. Smoked for 10 years.  <DIV'  Tobacco Use Assessment Completed:  Used Tobacco in last 30 days?   Has never drank.  Does not drink caffeine. Patient's occupation iEducational psychologist     Notes: 1 daughter   REVIEW OF SYSTEMS:     GU Review Female:   Patient reports frequent urination. Patient denies hard to postpone urination, burning /pain with urination, get up at night to urinate, leakage of urine, stream starts and stops, trouble starting your stream, have to strain to urinate, and being pregnant.    Gastrointestinal (Upper):  Patient reports nausea. Patient denies vomiting and indigestion/ heartburn.    Gastrointestinal (Lower):  Patient reports constipation. Patient denies diarrhea.    Constitutional:  Patient reports fatigue. Patient denies fever, night sweats, and weight loss.    Skin:  Patient denies skin rash/ lesion and itching.    Eyes:  Patient reports blurred vision. Patient denies double vision.    Ears/ Nose/ Throat:  Patient denies sore throat and sinus problems.    Hematologic/Lymphatic:  Patient denies swollen glands and easy bruising.    Cardiovascular:  Patient denies chest pains and leg swelling.    Respiratory:  Patient denies cough and shortness of breath.    Endocrine:  Patient denies excessive thirst.    Musculoskeletal:  Patient reports back pain. Patient denies joint pain.    Neurological:  Patient reports headaches. Patient denies dizziness.    Psychologic:  Patient denies depression and anxiety.    VITAL SIGNS:       03/12/2020 11:05 AM     Weight 198 lb / 89.81 kg  Height 66 in / 167.64 cm     BP 119/86 mmHg     Pulse 72 /min     Temperature 97.5 F / 36.3 C     BMI 32.0 kg/m     MULTI-SYSTEM PHYSICAL EXAMINATION:      Constitutional: Well-nourished. No physical deformities. Normally developed. Good grooming.     Respiratory: Normal breath sounds. No labored breathing, no use of accessory muscles.      Cardiovascular: Normal temperature, normal extremity pulses, no swelling, no varicosities.      Gastrointestinal: Abdominal tenderness, left CVAT. No mass, no rigidity, non obese abdomen.             Complexity of Data:   Records Review:  Previous Patient Records  Urine Test Review:  Urinalysis   X-Ray Review: [facility] Protocol: Reviewed Films. Reviewed Report. Discussed With Patient.     PROCEDURES:    KUB - [id number]  A single view of the abdomen is obtained. small right renal stones noted. 3x37m left proximal stone noted below L3. IUD in placed. No bone, gas or soft tissue abnormalities noted.       Patient confirmed No Neulasta OnPro Device.      Urinalysis - 81003  Dipstick Dipstick Cont'd  Color: Amber Bilirubin: Neg  Appearance: Clear Ketones: Neg  Specific Gravity: 1.025 Blood: Neg  pH: 5.5 Protein: Neg  Glucose: Neg Urobilinogen: 0.2   Nitrites: Neg   Leukocyte Esterase: Neg   Notes:       ASSESSMENT:     ICD-10 Details  1 GU:  Renal calculus - N20.0 Chronic, Worsening - She has small renal stones on the right.   2  Ureteral calculus - N20.1 Acute, Systemic Symptoms - The left proximal stone has not progressed. I discussed continued MET, ESWL or URS and will get her scheduled for ESWL. I reviewed the risks of ESWL including bleeding, infection, injury to the kidney or adjacent structures, failure to fragment the stone, need for ancillary procedures, thrombotic events, cardiac arrhythmias and sedation complications.    PLAN:   Orders  Labs Stone Analysis  X-Rays: KUB  Schedule   After a thorough review of the management options for the patient's condition the patient  elected to proceed with surgical therapy as noted above. We have discussed the potential benefits and risks of the procedure, side effects of the proposed treatment, the likelihood of the patient achieving the goals of the procedure, and any potential problems that might occur during the procedure or recuperation. Informed consent has been obtained.  No new changes

## 2020-03-15 ENCOUNTER — Encounter (HOSPITAL_BASED_OUTPATIENT_CLINIC_OR_DEPARTMENT_OTHER): Payer: Self-pay | Admitting: Urology

## 2020-03-15 ENCOUNTER — Ambulatory Visit (HOSPITAL_COMMUNITY)
Admission: RE | Admit: 2020-03-15 | Discharge: 2020-03-15 | Disposition: A | Discharge status: Home / Self Care | Payer: BC Managed Care – PPO | Attending: Urology | Admitting: Urology

## 2020-03-15 ENCOUNTER — Ambulatory Visit (HOSPITAL_COMMUNITY): Payer: BC Managed Care – PPO

## 2020-03-15 ENCOUNTER — Encounter (HOSPITAL_BASED_OUTPATIENT_CLINIC_OR_DEPARTMENT_OTHER)
Admission: RE | Disposition: A | Discharge status: Home / Self Care | Payer: Self-pay | Source: Home / Self Care | Attending: Urology

## 2020-03-15 ENCOUNTER — Other Ambulatory Visit: Payer: Self-pay

## 2020-03-15 DIAGNOSIS — E7800 Pure hypercholesterolemia, unspecified: Secondary | ICD-10-CM | POA: Diagnosis not present

## 2020-03-15 DIAGNOSIS — Z86718 Personal history of other venous thrombosis and embolism: Secondary | ICD-10-CM | POA: Insufficient documentation

## 2020-03-15 DIAGNOSIS — K219 Gastro-esophageal reflux disease without esophagitis: Secondary | ICD-10-CM | POA: Insufficient documentation

## 2020-03-15 DIAGNOSIS — Z87891 Personal history of nicotine dependence: Secondary | ICD-10-CM | POA: Insufficient documentation

## 2020-03-15 DIAGNOSIS — N202 Calculus of kidney with calculus of ureter: Secondary | ICD-10-CM | POA: Diagnosis not present

## 2020-03-15 DIAGNOSIS — Z79899 Other long term (current) drug therapy: Secondary | ICD-10-CM | POA: Insufficient documentation

## 2020-03-15 DIAGNOSIS — I10 Essential (primary) hypertension: Secondary | ICD-10-CM | POA: Diagnosis not present

## 2020-03-15 DIAGNOSIS — Z86711 Personal history of pulmonary embolism: Secondary | ICD-10-CM | POA: Insufficient documentation

## 2020-03-15 DIAGNOSIS — N201 Calculus of ureter: Secondary | ICD-10-CM

## 2020-03-15 HISTORY — PX: EXTRACORPOREAL SHOCK WAVE LITHOTRIPSY: SHX1557

## 2020-03-15 LAB — POCT PREGNANCY, URINE: Preg Test, Ur: NEGATIVE

## 2020-03-15 SURGERY — LITHOTRIPSY, ESWL
Anesthesia: LOCAL | Laterality: Left

## 2020-03-15 MED ORDER — DIAZEPAM 5 MG PO TABS
ORAL_TABLET | ORAL | Status: AC
  Filled 2020-03-15: qty 2

## 2020-03-15 MED ORDER — CIPROFLOXACIN HCL 500 MG PO TABS
500.0000 mg | ORAL_TABLET | ORAL | Status: AC
  Administered 2020-03-15: 500 mg via ORAL

## 2020-03-15 MED ORDER — SODIUM CHLORIDE 0.9 % IV SOLN: INTRAVENOUS | Status: DC

## 2020-03-15 MED ORDER — CIPROFLOXACIN HCL 500 MG PO TABS
ORAL_TABLET | ORAL | Status: AC
  Filled 2020-03-15: qty 1

## 2020-03-15 MED ORDER — OXYCODONE-ACETAMINOPHEN 5-325 MG PO TABS: 1.0000 | ORAL_TABLET | Freq: Four times a day (QID) | ORAL | 0 refills | Status: AC | PRN

## 2020-03-15 MED ORDER — DIPHENHYDRAMINE HCL 25 MG PO CAPS
ORAL_CAPSULE | ORAL | Status: AC
  Filled 2020-03-15: qty 1

## 2020-03-15 MED ORDER — DIPHENHYDRAMINE HCL 25 MG PO CAPS
25.0000 mg | ORAL_CAPSULE | ORAL | Status: AC
  Administered 2020-03-15: 25 mg via ORAL

## 2020-03-15 MED ORDER — DIAZEPAM 5 MG PO TABS
10.0000 mg | ORAL_TABLET | ORAL | Status: AC
  Administered 2020-03-15: 10 mg via ORAL

## 2020-03-15 NOTE — Discharge Instructions (Signed)
I have reviewed discharge instructions in detail with the patient. They will follow-up with me or their physician as scheduled. My nurse will also be calling the patients as per protocol.        Post Anesthesia Home Care Instructions  Activity: Get plenty of rest for the remainder of the day. A responsible individual must stay with you for 24 hours following the procedure.  For the next 24 hours, DO NOT: -Drive a car -Operate machinery -Drink alcoholic beverages -Take any medication unless instructed by your physician -Make any legal decisions or sign important papers.  Meals: Start with liquid foods such as gelatin or soup. Progress to regular foods as tolerated. Avoid greasy, spicy, heavy foods. If nausea and/or vomiting occur, drink only clear liquids until the nausea and/or vomiting subsides. Call your physician if vomiting continues.  Special Instructions/Symptoms: Your throat may feel dry or sore from the anesthesia or the breathing tube placed in your throat during surgery. If this causes discomfort, gargle with warm salt water. The discomfort should disappear within 24 hours.  

## 2020-03-15 NOTE — Interval H&P Note (Signed)
History and Physical Interval Note:  03/15/2020 1:50 PM  [name]  has presented today for surgery, with the diagnosis of LEFT PROXIMAL STONE.  The various methods of treatment have been discussed with the patient and family. After consideration of risks, benefits and other options for treatment, the patient has consented to  Procedure(s): EXTRACORPOREAL SHOCK WAVE LITHOTRIPSY (ESWL) (Left) as a surgical intervention.  The patient's history has been reviewed, patient examined, no change in status, stable for surgery.  I have reviewed the patient's chart and labs.  Questions were answered to the patient's satisfaction.     Keniel Ralston A Aliscia Clayton

## 2020-03-16 ENCOUNTER — Encounter (HOSPITAL_BASED_OUTPATIENT_CLINIC_OR_DEPARTMENT_OTHER): Payer: Self-pay | Admitting: Urology

## 2020-03-30 DIAGNOSIS — N202 Calculus of kidney with calculus of ureter: Secondary | ICD-10-CM | POA: Diagnosis not present

## 2020-04-02 ENCOUNTER — Encounter: Payer: Self-pay | Admitting: Obstetrics & Gynecology

## 2020-04-02 ENCOUNTER — Other Ambulatory Visit: Payer: Self-pay

## 2020-04-02 ENCOUNTER — Ambulatory Visit (INDEPENDENT_AMBULATORY_CARE_PROVIDER_SITE_OTHER): Payer: BC Managed Care – PPO | Admitting: Obstetrics & Gynecology

## 2020-04-02 VITALS — BP 116/72 | Ht 66.0 in | Wt 206.0 lb

## 2020-04-02 DIAGNOSIS — Z01419 Encounter for gynecological examination (general) (routine) without abnormal findings: Secondary | ICD-10-CM | POA: Diagnosis not present

## 2020-04-02 DIAGNOSIS — E6609 Other obesity due to excess calories: Secondary | ICD-10-CM | POA: Diagnosis not present

## 2020-04-02 DIAGNOSIS — Z30431 Encounter for routine checking of intrauterine contraceptive device: Secondary | ICD-10-CM

## 2020-04-02 DIAGNOSIS — Z9884 Bariatric surgery status: Secondary | ICD-10-CM | POA: Diagnosis not present

## 2020-04-02 DIAGNOSIS — Z6833 Body mass index (BMI) 33.0-33.9, adult: Secondary | ICD-10-CM

## 2020-04-02 NOTE — Progress Notes (Signed)
Betty Perkins 09-Sep-1976 [id number]   History:    44 y.o. G1P1L1 Married.  Daughter is 17 yo.  RP:  Established patient presenting for annual gyn exam   HPI: Well on Mirena IUD since May 2018.  No breakthrough bleeding.  No pelvic pain.  No pain with intercourse.  Normal vaginal secretions.  Urine and bowel movements normal.  Breasts normal.  Body mass index decreased to 33.25.  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  Doing well on a low calorie/carb diet currently with regular physical activities.  Health labs with family physician.   Past medical history,surgical history, family history and social history were all reviewed and documented in the EPIC chart.  Gynecologic History No LMP recorded. (Menstrual status: IUD).  Obstetric History OB History  Gravida Para Term Preterm AB Living  1 1 1  0 0 1  SAB TAB Ectopic Multiple Live Births  0 0 0 0 1    # Outcome Date GA Lbr Len/2nd Weight Sex Delivery Anes PTL Lv  1 Term 01/31/16 [redacted]w[redacted]d  7 lb 5.6 oz (3.335 kg) F CS-LTranv EPI  LIV     ROS: A ROS was performed and pertinent positives and negatives are included in the history.  GENERAL: No fevers or chills. HEENT: No change in vision, no earache, sore throat or sinus congestion. NECK: No pain or stiffness. CARDIOVASCULAR: No chest pain or pressure. No palpitations. PULMONARY: No shortness of breath, cough or wheeze. GASTROINTESTINAL: No abdominal pain, nausea, vomiting or diarrhea, melena or bright red blood per rectum. GENITOURINARY: No urinary frequency, urgency, hesitancy or dysuria. MUSCULOSKELETAL: No joint or muscle pain, no back pain, no recent trauma. DERMATOLOGIC: No rash, no itching, no lesions. ENDOCRINE: No polyuria, polydipsia, no heat or cold intolerance. No recent change in weight. HEMATOLOGICAL: No anemia or easy bruising or bleeding. NEUROLOGIC: No headache, seizures, numbness, tingling or weakness. PSYCHIATRIC: No depression, no loss of interest in normal activity or  change in sleep pattern.     Exam:   BP 116/72   Ht 5\' 6"  (1.676 m)   Wt 206 lb (93.4 kg)   BMI 33.25 kg/m   Body mass index is 33.25 kg/m.  General appearance : Well developed well nourished female. No acute distress HEENT: Eyes: no retinal hemorrhage or exudates,  Neck supple, trachea midline, no carotid bruits, no thyroidmegaly Lungs: Clear to auscultation, no rhonchi or wheezes, or rib retractions  Heart: Regular rate and rhythm, no murmurs or gallops Breast:Examined in sitting and supine position were symmetrical in appearance, no palpable masses or tenderness,  no skin retraction, no nipple inversion, no nipple discharge, no skin discoloration, no axillary or supraclavicular lymphadenopathy Abdomen: no palpable masses or tenderness, no rebound or guarding Extremities: no edema or skin discoloration or tenderness  Pelvic: Vulva: Normal             Vagina: No gross lesions or discharge  Cervix: No gross lesions or discharge. IUD strings visible at [facility].  Pap reflex done.  Uterus  AV, normal size, shape and consistency, non-tender and mobile  Adnexa  Without masses or tenderness  Anus: Normal   Assessment/Plan:  44 y.o. female for annual exam   1. Encounter for routine gynecological examination with Papanicolaou smear of cervix Normal gynecologic exam.  Pap reflex done.  Breast exam normal.  Last mammogram June 2020 was negative, will schedule a mammogram now.  Health labs with family physician.  2. Encounter for routine checking of intrauterine contraceptive device (IUD)  Mirena IUD in good position and well-tolerated since 2018.  3. Status post bariatric surgery Very good weight loss post surgery in 2019.  4. Class 1 obesity due to excess calories without serious comorbidity with body mass index (BMI) of 33.0 to 33.9 in adult Excellent weight loss post gastric sleeve surgery in 2019.  Continue on a low calorie/carb nutrition plan.  Increase aerobic activities to 5 times a  week with light weightlifting every 2 days.  [name], 2:10 PM 04/02/2020

## 2020-04-03 LAB — PAP IG W/ RFLX HPV ASCU

## 2020-04-12 ENCOUNTER — Other Ambulatory Visit: Payer: Self-pay | Admitting: Obstetrics & Gynecology

## 2020-04-12 DIAGNOSIS — Z1231 Encounter for screening mammogram for malignant neoplasm of breast: Secondary | ICD-10-CM

## 2020-04-16 ENCOUNTER — Ambulatory Visit
Admission: RE | Admit: 2020-04-16 | Discharge: 2020-04-16 | Disposition: A | Discharge status: Home / Self Care | Payer: BC Managed Care – PPO | Source: Ambulatory Visit

## 2020-04-16 ENCOUNTER — Other Ambulatory Visit: Payer: Self-pay

## 2020-04-16 DIAGNOSIS — Z1231 Encounter for screening mammogram for malignant neoplasm of breast: Secondary | ICD-10-CM

## 2020-04-19 ENCOUNTER — Other Ambulatory Visit: Payer: Self-pay | Admitting: Obstetrics & Gynecology

## 2020-04-19 DIAGNOSIS — R928 Other abnormal and inconclusive findings on diagnostic imaging of breast: Secondary | ICD-10-CM

## 2020-04-30 ENCOUNTER — Ambulatory Visit
Admission: RE | Admit: 2020-04-30 | Discharge: 2020-04-30 | Disposition: A | Discharge status: Home / Self Care | Payer: BC Managed Care – PPO | Source: Ambulatory Visit | Attending: Obstetrics & Gynecology | Admitting: Obstetrics & Gynecology

## 2020-04-30 ENCOUNTER — Other Ambulatory Visit: Payer: Self-pay

## 2020-04-30 DIAGNOSIS — R928 Other abnormal and inconclusive findings on diagnostic imaging of breast: Secondary | ICD-10-CM

## 2020-04-30 DIAGNOSIS — N6320 Unspecified lump in the left breast, unspecified quadrant: Secondary | ICD-10-CM | POA: Diagnosis not present

## 2020-07-02 DIAGNOSIS — Z903 Acquired absence of stomach [part of]: Secondary | ICD-10-CM | POA: Diagnosis not present

## 2020-07-02 DIAGNOSIS — K912 Postsurgical malabsorption, not elsewhere classified: Secondary | ICD-10-CM | POA: Diagnosis not present

## 2020-07-02 DIAGNOSIS — Z9049 Acquired absence of other specified parts of digestive tract: Secondary | ICD-10-CM | POA: Diagnosis not present

## 2020-08-17 DIAGNOSIS — I10 Essential (primary) hypertension: Secondary | ICD-10-CM | POA: Diagnosis not present

## 2020-08-17 DIAGNOSIS — Z13220 Encounter for screening for lipoid disorders: Secondary | ICD-10-CM | POA: Diagnosis not present

## 2020-08-17 DIAGNOSIS — Z9884 Bariatric surgery status: Secondary | ICD-10-CM | POA: Diagnosis not present

## 2020-09-11 DIAGNOSIS — R519 Headache, unspecified: Secondary | ICD-10-CM | POA: Diagnosis not present

## 2020-09-11 DIAGNOSIS — U071 COVID-19: Secondary | ICD-10-CM | POA: Diagnosis not present

## 2020-09-11 DIAGNOSIS — J0140 Acute pansinusitis, unspecified: Secondary | ICD-10-CM | POA: Diagnosis not present

## 2020-09-11 DIAGNOSIS — Z03818 Encounter for observation for suspected exposure to other biological agents ruled out: Secondary | ICD-10-CM | POA: Diagnosis not present

## 2020-09-11 DIAGNOSIS — Z20822 Contact with and (suspected) exposure to covid-19: Secondary | ICD-10-CM | POA: Diagnosis not present

## 2020-11-02 DIAGNOSIS — R12 Heartburn: Secondary | ICD-10-CM | POA: Diagnosis not present

## 2020-12-06 IMAGING — MG DIGITAL SCREENING BILATERAL MAMMOGRAM WITH TOMO AND CAD
6 of 10 series · 6 of 30 positions shown · non-contrast
Comparison: Previous exam(s).

CLINICAL DATA: Screening.

EXAM:
DIGITAL SCREENING BILATERAL MAMMOGRAM WITH TOMO AND CAD

[R CC synth-2D]
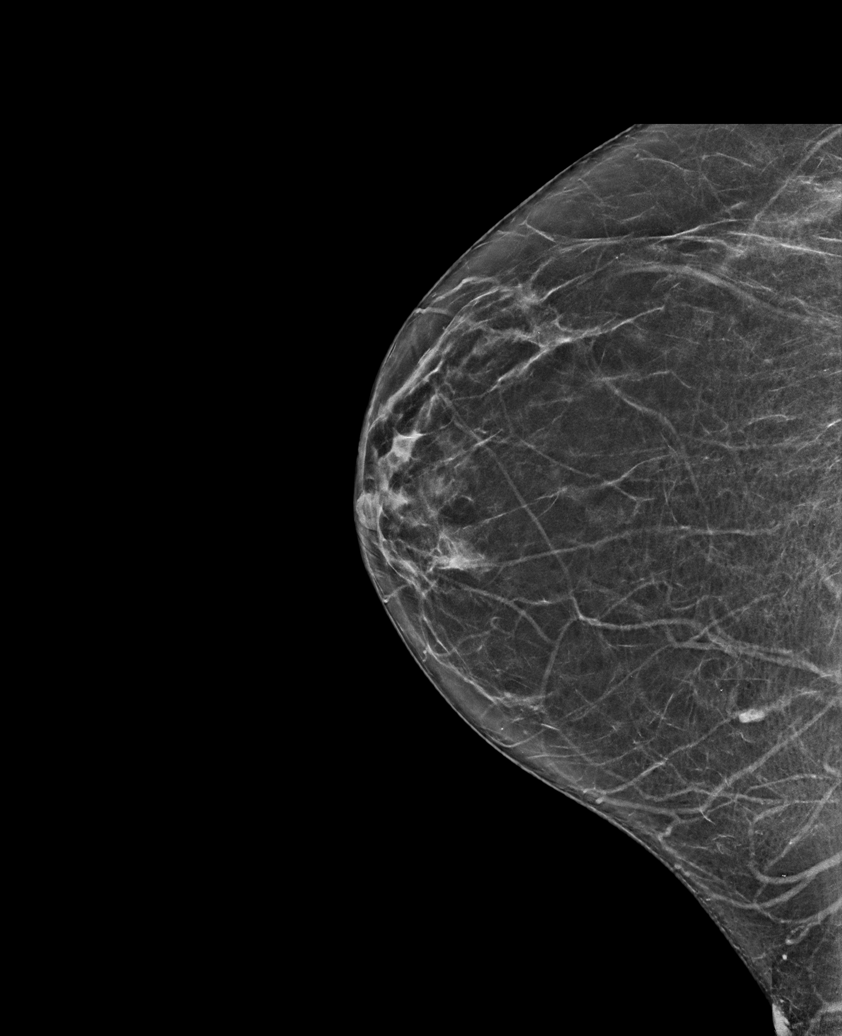

[L MLO synth-2D (1 of 2)]
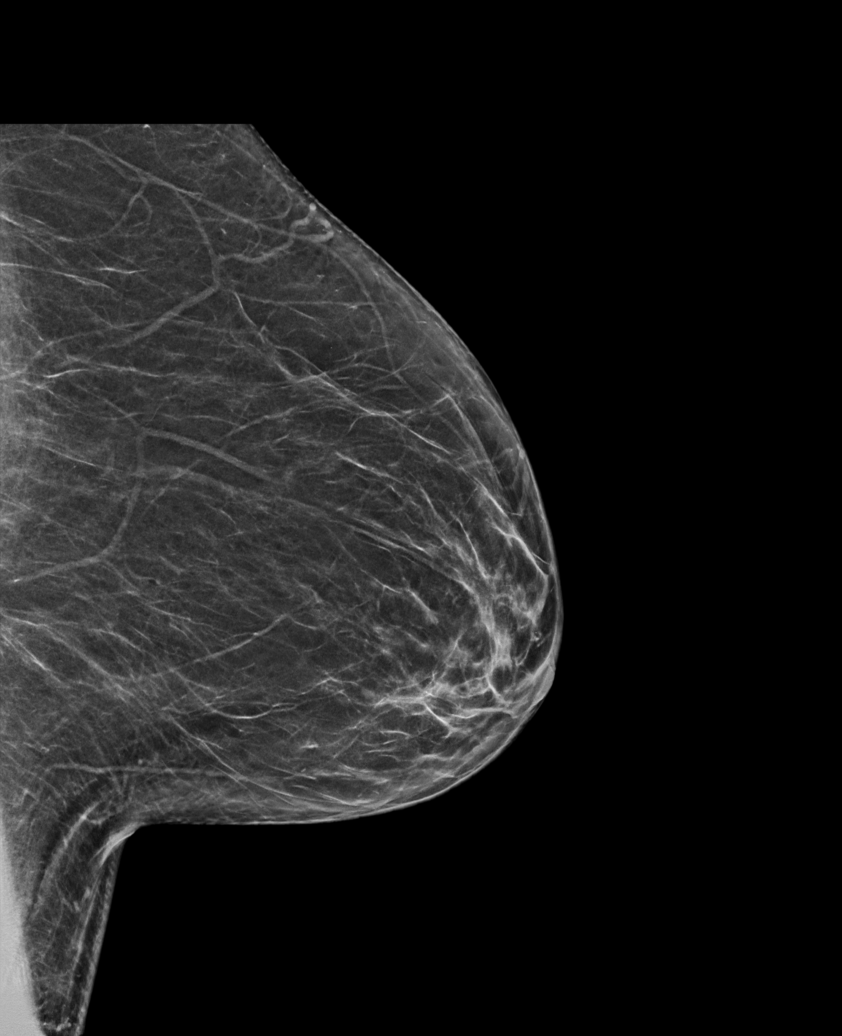

[R MLO synth-2D]
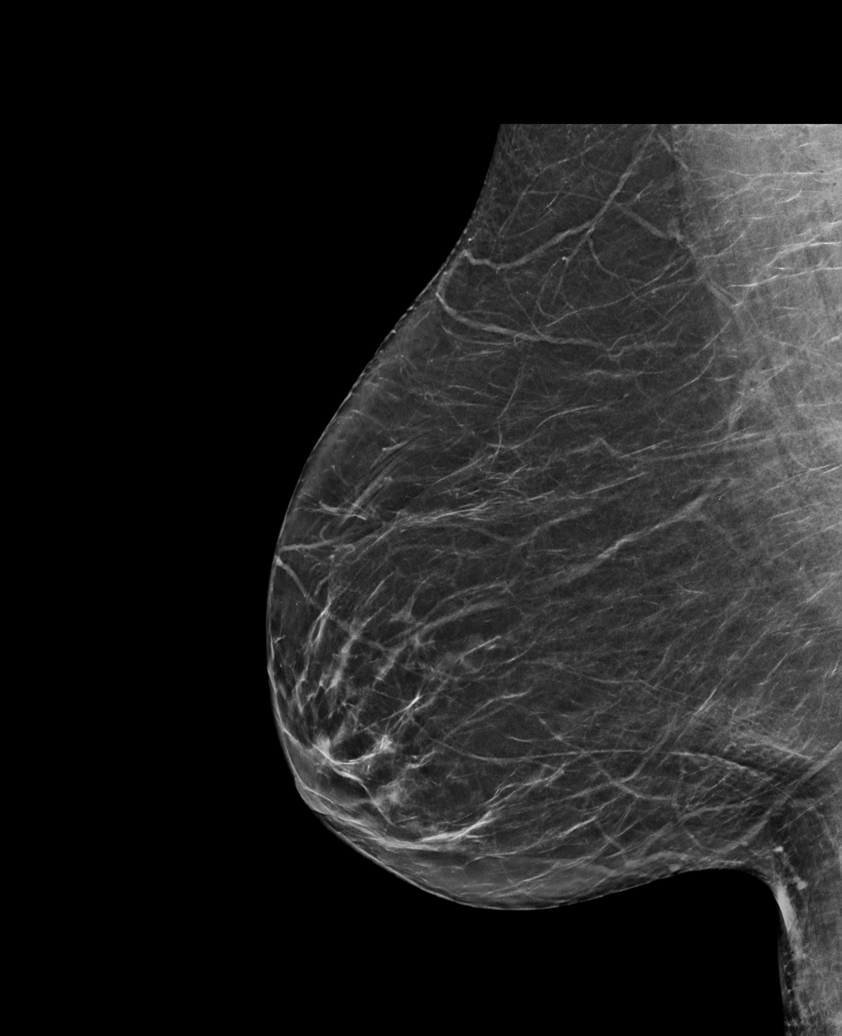

[L MLO synth-2D (2 of 2)]
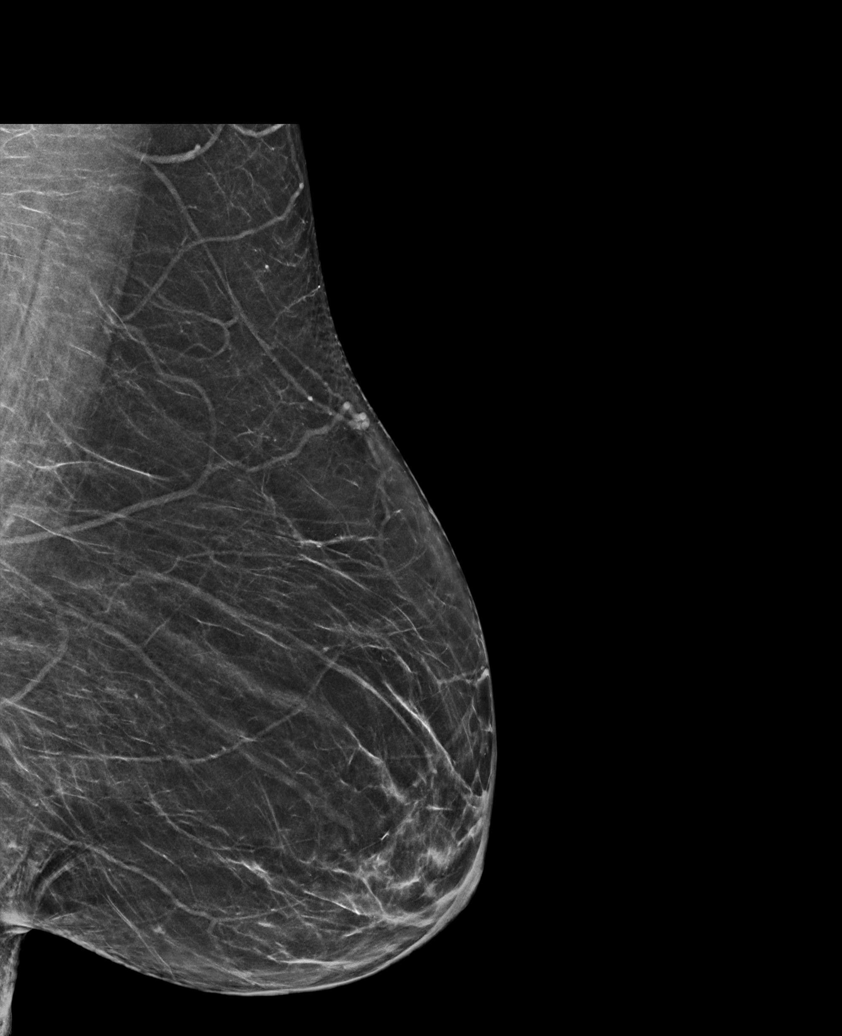

[L CC synth-2D]
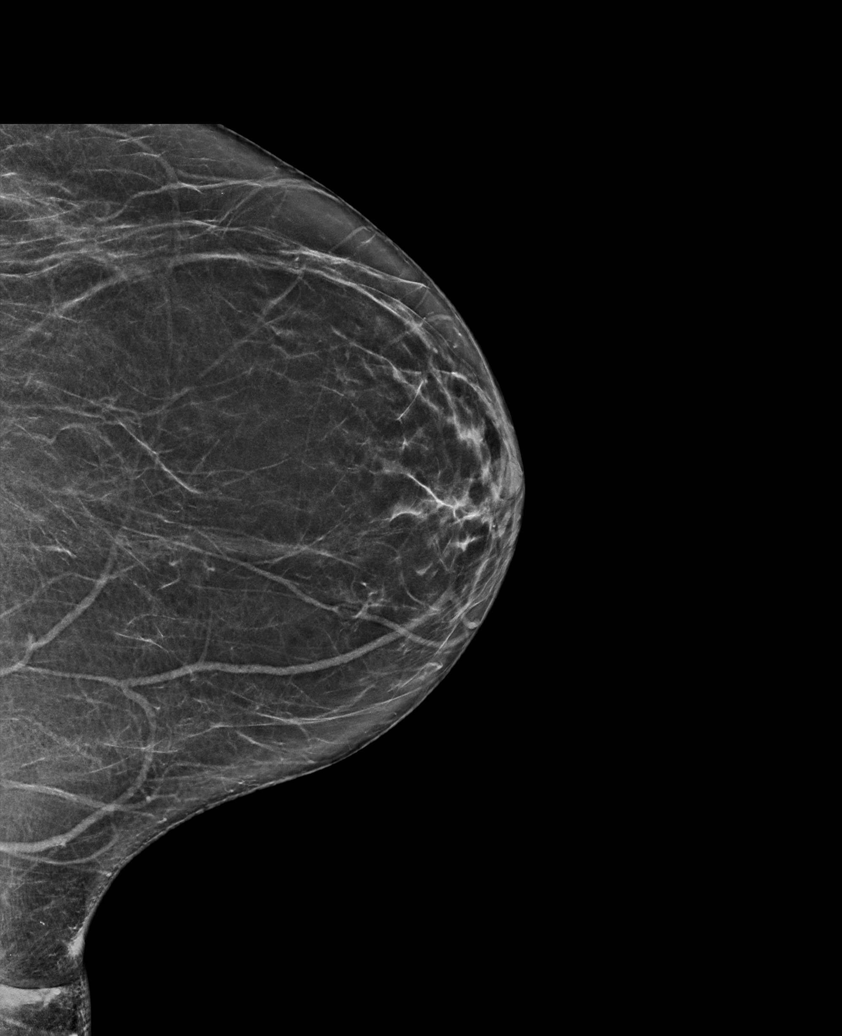

[R CC tomo · tomo slice 32/63.0]
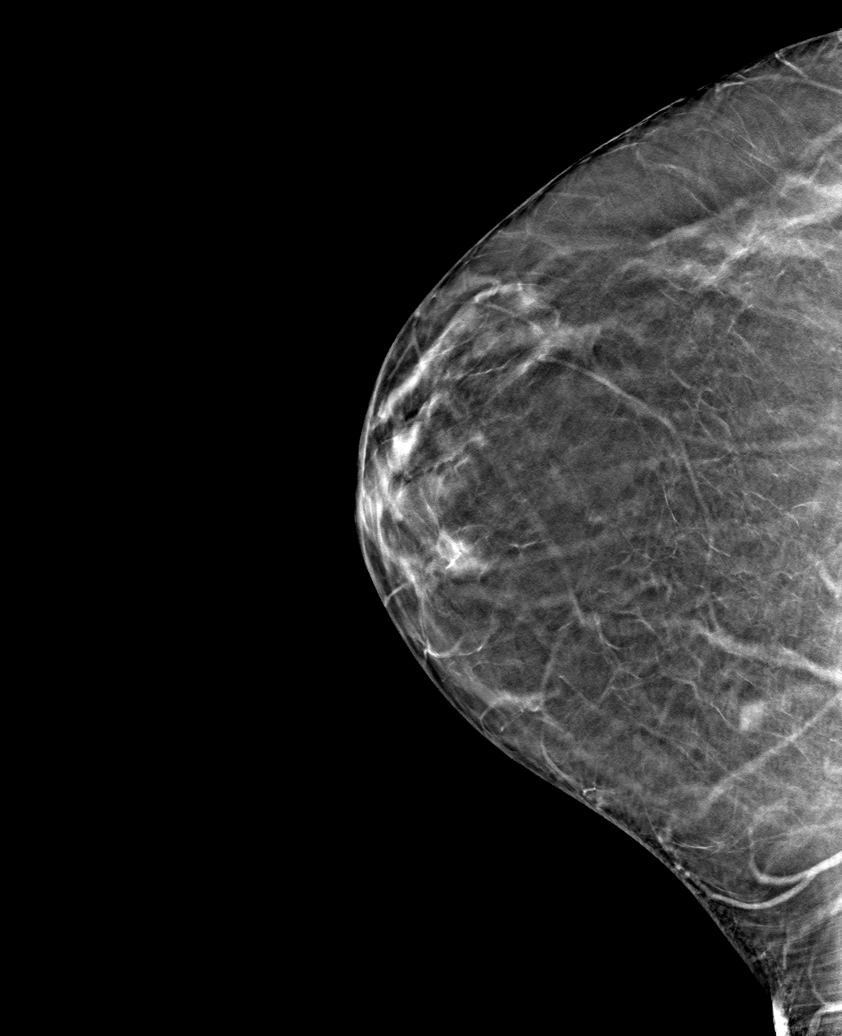

[6 of 30 positions shown; findings below may reference images not displayed]

ACR Breast Density Category b: There are scattered areas of
fibroglandular density.
FINDINGS: There are no findings suspicious for malignancy. Images were
processed with CAD.
IMPRESSION: No mammographic evidence of malignancy. A result letter of this
screening mammogram will be mailed directly to the patient.

RECOMMENDATION:
Screening mammogram in one year. (Code:[id number])

BI-RADS CATEGORY  1: Negative.

## 2021-02-18 DIAGNOSIS — I10 Essential (primary) hypertension: Secondary | ICD-10-CM | POA: Diagnosis not present

## 2021-02-18 DIAGNOSIS — E162 Hypoglycemia, unspecified: Secondary | ICD-10-CM | POA: Diagnosis not present

## 2021-02-18 DIAGNOSIS — R55 Syncope and collapse: Secondary | ICD-10-CM | POA: Diagnosis not present

## 2021-02-18 DIAGNOSIS — Z9884 Bariatric surgery status: Secondary | ICD-10-CM | POA: Diagnosis not present

## 2021-02-26 ENCOUNTER — Other Ambulatory Visit: Payer: Self-pay | Admitting: Obstetrics & Gynecology

## 2021-02-26 DIAGNOSIS — Z1231 Encounter for screening mammogram for malignant neoplasm of breast: Secondary | ICD-10-CM

## 2021-03-04 DIAGNOSIS — N838 Other noninflammatory disorders of ovary, fallopian tube and broad ligament: Secondary | ICD-10-CM | POA: Diagnosis not present

## 2021-03-04 DIAGNOSIS — R109 Unspecified abdominal pain: Secondary | ICD-10-CM | POA: Diagnosis not present

## 2021-03-04 DIAGNOSIS — Z28311 Partially vaccinated for covid-19: Secondary | ICD-10-CM | POA: Diagnosis not present

## 2021-03-04 DIAGNOSIS — R112 Nausea with vomiting, unspecified: Secondary | ICD-10-CM | POA: Diagnosis not present

## 2021-03-04 DIAGNOSIS — R142 Eructation: Secondary | ICD-10-CM | POA: Diagnosis not present

## 2021-03-04 DIAGNOSIS — R110 Nausea: Secondary | ICD-10-CM | POA: Diagnosis not present

## 2021-03-04 DIAGNOSIS — I10 Essential (primary) hypertension: Secondary | ICD-10-CM | POA: Diagnosis not present

## 2021-03-04 DIAGNOSIS — N200 Calculus of kidney: Secondary | ICD-10-CM | POA: Diagnosis not present

## 2021-03-04 DIAGNOSIS — K219 Gastro-esophageal reflux disease without esophagitis: Secondary | ICD-10-CM | POA: Diagnosis not present

## 2021-03-04 DIAGNOSIS — Z975 Presence of (intrauterine) contraceptive device: Secondary | ICD-10-CM | POA: Diagnosis not present

## 2021-03-04 DIAGNOSIS — K5790 Diverticulosis of intestine, part unspecified, without perforation or abscess without bleeding: Secondary | ICD-10-CM | POA: Diagnosis not present

## 2021-03-04 DIAGNOSIS — N132 Hydronephrosis with renal and ureteral calculous obstruction: Secondary | ICD-10-CM | POA: Diagnosis not present

## 2021-03-04 DIAGNOSIS — Z87442 Personal history of urinary calculi: Secondary | ICD-10-CM | POA: Diagnosis not present

## 2021-03-06 ENCOUNTER — Other Ambulatory Visit: Payer: Self-pay | Admitting: Urology

## 2021-03-06 DIAGNOSIS — N201 Calculus of ureter: Secondary | ICD-10-CM | POA: Diagnosis not present

## 2021-03-06 DIAGNOSIS — R8271 Bacteriuria: Secondary | ICD-10-CM | POA: Diagnosis not present

## 2021-03-06 DIAGNOSIS — N200 Calculus of kidney: Secondary | ICD-10-CM

## 2021-03-07 NOTE — Progress Notes (Signed)
Left message to call back  

## 2021-03-08 NOTE — Progress Notes (Signed)
Patient to arrive at 1030 on 03/11/2021. History and medications reviewed. Pre-procedure instructions given. NPO after MN of Sunday except for clear liquids until 0830. Driver secured.

## 2021-03-08 NOTE — H&P (Signed)
Office Visit Report     03/06/2021   --------------------------------------------------------------------------------   Betty Perkins  MRN: [id number]  DOB: 03-23-1976, 45 year old Female  SSN: [id number]   PRIMARY CARE:  Harlan Stains, MD  REFERRING:    PROVIDER:  Irine Seal, M.D.  LOCATION:  [facility] [phone]     --------------------------------------------------------------------------------   CC: I have ureteral stone.  HPI: Betty Perkins is a 45 year-old female established patient who is here for ureteral stone.  The problem is on the right side.   03/06/21: Betty Perkins returns today in f/u. She was in the [place] ER on 6/20 with right flank pain that started that day. The pain was severe and associated with nausea and vomiting. She has had no fever or voiding complaints. Her CT showed a 37m RUPJ stone. She has some pressure today.   03/30/20: Patient with below noted history. She is status post left ESWL on 07/01. She states that she has passed multiple stone fragments since her procedure. Overall, she has been doing well. She did have some mild left flank pain on Tuesday of this week. She noted this after a day of traveling. She denies any current or interval episodes of flank or abdominal pain. She currently feels she is voiding at baseline. No complaints of dysuria, gross hematuria, fever, chills, nausea, or vomiting. Stone composition was noted to be calcium oxalate. She does have prior history of stones. She would like to consider moving forward with metabolic evaluation today.   03/12/20: Betty Perkins a 45yo female with a history stones who was last seen by [facility] 2008. She has had a few in the last several years. [place] and then passed a stone on 6/9. She had the onset of left flank pain on 6/12 and she got relief until yesterday and began to have recurrent left flank pain. A CT on 02/19/20 showed  a 4.674mleft proximal stone and small renal stones on the right. She has had no hematuria. She some urgency and frequency today. She had back pain yesterday with suprapubic cramping. Her prior stones were CaOx.     ALLERGIES: No Allergies    MEDICATIONS: Amlodipine Besylate 5 mg tablet  Calcium + D3  Colace  Fish Oil  Mirena IUD  Multivitamin  Ondansetron Hcl 4 mg tablet  Prevacid 15 mg capsule,delayed release  Probiotic     GU PSH: ESWL, Left - 03/15/2020       PSH Notes: gastric sleeve 2019, rt ankle lt foot surgery   NON-GU PSH: Cesarean Delivery     GU PMH: Renal calculus - 03/30/2020, She has small renal stones on the right. , - 03/12/2020 Ureteral calculus - 03/30/2020, The left proximal stone has not progressed. I discussed continued MET, ESWL or URS and will get her scheduled for ESWL. I reviewed the risks of ESWL including bleeding, infection, injury to the kidney or adjacent structures, failure to fragment the stone, need for ancillary procedures, thrombotic events, cardiac arrhythmias and sedation complications. , - 03/12/2020      PMH Notes:  1898-09-15 00:00:00 - Note: Normal Routine History And Physical Adult, left leg DVT and pulmonary embolism ? due to birth control,     clotting factor V runs in family Pt states she has tested negative for it   NON-GU PMH: DVT, History GERD Hypercholesterolemia Hypertension Pulmonary Embolism, History    FAMILY HISTORY:  Congenital Clotting Factor Deficiency - Mother, Father, Runs in Family, Brother, Cousin   SOCIAL HISTORY: Marital Status: Married Preferred Language: English; Race: White Current Smoking Status: Patient does not smoke anymore. Has not smoked since 02/13/2005. Smoked for 10 years.   Tobacco Use Assessment Completed: Used Tobacco in last 30 days? Has never drank.  Does not drink caffeine. Patient's occupation Educational psychologist.     Notes: 1 daughter   REVIEW OF SYSTEMS:    GU Review Female:   Patient  denies frequent urination, hard to postpone urination, burning /pain with urination, get up at night to urinate, leakage of urine, stream starts and stops, trouble starting your stream, have to strain to urinate, and being pregnant.  Gastrointestinal (Upper):   Patient denies nausea, vomiting, and indigestion/ heartburn.  Gastrointestinal (Lower):   Patient denies diarrhea and constipation.  Constitutional:   Patient denies fever, night sweats, weight loss, and fatigue.  Skin:   Patient denies skin rash/ lesion and itching.  Eyes:   Patient denies blurred vision and double vision.  Ears/ Nose/ Throat:   Patient denies sore throat and sinus problems.  Hematologic/Lymphatic:   Patient denies swollen glands and easy bruising.  Cardiovascular:   Patient denies leg swelling and chest pains.  Respiratory:   Patient denies cough and shortness of breath.  Endocrine:   Patient denies excessive thirst.  Musculoskeletal:   Patient denies back pain and joint pain.  Neurological:   Patient denies headaches and dizziness.  Psychologic:   Patient denies depression and anxiety.   VITAL SIGNS:      03/06/2021 08:46 AM  Weight 220 lb / 99.79 kg  BP 137/90 mmHg  Pulse 71 /min  Temperature 97.8 F / 36.5 C   MULTI-SYSTEM PHYSICAL EXAMINATION:    Constitutional: Well-nourished. No physical deformities. Normally developed. Good grooming.  Respiratory: Normal breath sounds. No labored breathing, no use of accessory muscles.   Cardiovascular: Regular rate and rhythm. No murmur, no gallop.  Gastrointestinal: Abdominal tenderness very mild R flank, obese. No mass, no rigidity.      Complexity of Data:  Lab Test Review:   BMP, CBC with Diff  Records Review:   Previous Hospital Records, Previous Patient Records  Urine Test Review:   Urinalysis  X-Ray Review: KUB: Reviewed Films. Discussed With Patient.  [facility] Protocol: Reviewed Report. Discussed With Patient.     PROCEDURES:         KUB - [id number]  A  single view of the abdomen is obtained. There is a 5.13m stone at L5 on the right. No other stones are seen. An IUD is in placed. There are no bone, gas or soft tissue shadows.       . Patient confirmed No Neulasta OnPro Device.           Urinalysis w/Scope Dipstick Dipstick Cont'd Micro  Color: Amber Bilirubin: Neg mg/dL WBC/hpf: 10 - 20/hpf  Appearance: Cloudy Ketones: Neg mg/dL RBC/hpf: 3 - 10/hpf  Specific Gravity: 1.020 Blood: 3+ ery/uL Bacteria: Few (10-25/hpf)  pH: 5.5 Protein: 2+ mg/dL Cystals: NS (Not Seen)  Glucose: Neg mg/dL Urobilinogen: 0.2 mg/dL Casts: Granular    Nitrites: Neg Trichomonas: Not Present    Leukocyte Esterase: 1+ leu/uL Mucous: Present      Epithelial Cells: 0 - 5/hpf      Yeast: NS (Not Seen)      Sperm: Not Present    ASSESSMENT:      ICD-10 Details  1 GU:   Ureteral calculus -  N20.1 Acute, Systemic Symptoms - She has a 11m right proximal stone. I discussed continued MET, ESWL and URS. I will get her scheduled for ESWL next monday since he has travel scheduled later next week. I reviewed the risks of ESWL including bleeding, infection, injury to the kidney or adjacent structures, failure to fragment the stone, need for ancillary procedures, thrombotic events, cardiac arrhythmias and sedation complications.   2 NON-GU:   Bacteriuria - R82.71 Minor - Her UA in the ER only had RBC's but she has some pyuria and bacteriuria today so I will get a culture. She has no UTI symptoms or fever but since she is to have ESWL I want to make sure there is no infection.    PLAN:           Orders Labs Urine Culture  X-Rays: KUB          Schedule Return Visit/Planned Activity: ASAP - Schedule Surgery          Document Letter(s):  Created for Patient: Clinical Summary         Notes:   An adnexal cyst was noted on CT and recommended she reach out to her Gyn, Dr. LDellis Filbert for further evaluation.   CC: Dr. CHarlan Stains     * Signed by JIrine Seal M.D. on  03/06/21 at 9:36 AM (EDT)*

## 2021-03-11 ENCOUNTER — Ambulatory Visit (HOSPITAL_BASED_OUTPATIENT_CLINIC_OR_DEPARTMENT_OTHER)
Admission: RE | Admit: 2021-03-11 | Discharge: 2021-03-11 | Disposition: A | Discharge status: Home / Self Care | Payer: BC Managed Care – PPO | Attending: Urology | Admitting: Urology

## 2021-03-11 ENCOUNTER — Encounter (HOSPITAL_BASED_OUTPATIENT_CLINIC_OR_DEPARTMENT_OTHER)
Admission: RE | Disposition: A | Discharge status: Home / Self Care | Payer: Self-pay | Source: Home / Self Care | Attending: Urology

## 2021-03-11 ENCOUNTER — Ambulatory Visit (HOSPITAL_COMMUNITY): Payer: BC Managed Care – PPO

## 2021-03-11 ENCOUNTER — Encounter (HOSPITAL_BASED_OUTPATIENT_CLINIC_OR_DEPARTMENT_OTHER): Payer: Self-pay | Admitting: Urology

## 2021-03-11 ENCOUNTER — Other Ambulatory Visit: Payer: Self-pay

## 2021-03-11 DIAGNOSIS — N200 Calculus of kidney: Secondary | ICD-10-CM

## 2021-03-11 DIAGNOSIS — Z86718 Personal history of other venous thrombosis and embolism: Secondary | ICD-10-CM | POA: Insufficient documentation

## 2021-03-11 DIAGNOSIS — Z79899 Other long term (current) drug therapy: Secondary | ICD-10-CM | POA: Diagnosis not present

## 2021-03-11 DIAGNOSIS — Z87442 Personal history of urinary calculi: Secondary | ICD-10-CM | POA: Diagnosis not present

## 2021-03-11 DIAGNOSIS — Z87891 Personal history of nicotine dependence: Secondary | ICD-10-CM | POA: Diagnosis not present

## 2021-03-11 DIAGNOSIS — Z86711 Personal history of pulmonary embolism: Secondary | ICD-10-CM | POA: Insufficient documentation

## 2021-03-11 DIAGNOSIS — N201 Calculus of ureter: Secondary | ICD-10-CM | POA: Insufficient documentation

## 2021-03-11 DIAGNOSIS — I10 Essential (primary) hypertension: Secondary | ICD-10-CM | POA: Diagnosis not present

## 2021-03-11 HISTORY — PX: EXTRACORPOREAL SHOCK WAVE LITHOTRIPSY: SHX1557

## 2021-03-11 LAB — POCT PREGNANCY, URINE: Preg Test, Ur: NEGATIVE

## 2021-03-11 SURGERY — LITHOTRIPSY, ESWL
Anesthesia: LOCAL | Laterality: Right

## 2021-03-11 MED ORDER — SODIUM CHLORIDE 0.9 % IV SOLN: INTRAVENOUS | Status: DC

## 2021-03-11 MED ORDER — DIAZEPAM 5 MG PO TABS
10.0000 mg | ORAL_TABLET | ORAL | Status: AC
  Administered 2021-03-11: 10 mg via ORAL

## 2021-03-11 MED ORDER — DIPHENHYDRAMINE HCL 25 MG PO CAPS
25.0000 mg | ORAL_CAPSULE | ORAL | Status: AC
  Administered 2021-03-11: 25 mg via ORAL

## 2021-03-11 MED ORDER — CIPROFLOXACIN HCL 500 MG PO TABS
500.0000 mg | ORAL_TABLET | ORAL | Status: AC
  Administered 2021-03-11: 500 mg via ORAL

## 2021-03-11 MED ORDER — DIAZEPAM 5 MG PO TABS
ORAL_TABLET | ORAL | Status: AC
  Filled 2021-03-11: qty 2

## 2021-03-11 MED ORDER — CIPROFLOXACIN HCL 500 MG PO TABS
ORAL_TABLET | ORAL | Status: AC
  Filled 2021-03-11: qty 1

## 2021-03-11 MED ORDER — DIPHENHYDRAMINE HCL 25 MG PO CAPS
ORAL_CAPSULE | ORAL | Status: AC
  Filled 2021-03-11: qty 1

## 2021-03-11 NOTE — Progress Notes (Signed)
Called to come in at [phone]

## 2021-03-11 NOTE — Interval H&P Note (Signed)
History and Physical Interval Note:  03/11/2021 10:21 AM  Betty Perkins  has presented today for surgery, with the diagnosis of RIGHT PROXIMAL STONE.  The various methods of treatment have been discussed with the patient and family. After consideration of risks, benefits and other options for treatment, the patient has consented to  Procedure(s): RIGHT EXTRACORPOREAL SHOCK WAVE LITHOTRIPSY (ESWL) (Right) as a surgical intervention.  The patient's history has been reviewed, patient examined, no change in status, stable for surgery.  I have reviewed the patient's chart and labs.  Questions were answered to the patient's satisfaction.     [employer]

## 2021-03-11 NOTE — Discharge Instructions (Signed)
1. You should strain your urine and collect all fragments and bring them to your follow up appointment.  °2. You should take your pain medication as needed.  Please call if your pain is severe to the point that it is not controlled with your pain medication. °3. You should call if you develop fever > 101 or persistent nausea or vomiting. °4. Your doctor may prescribe tamsulosin to take to help facilitate stone passage. °

## 2021-03-11 NOTE — Op Note (Signed)
See [facility] operative note scanned into chart. Also because of the size, density, location and other factors that cannot be anticipated I feel this will likely be a staged procedure. This fact supersedes any indication in the scanned [facility] operative note to the contrary.  

## 2021-03-12 ENCOUNTER — Encounter (HOSPITAL_BASED_OUTPATIENT_CLINIC_OR_DEPARTMENT_OTHER): Payer: Self-pay | Admitting: Urology

## 2021-03-26 DIAGNOSIS — N201 Calculus of ureter: Secondary | ICD-10-CM | POA: Diagnosis not present

## 2021-03-27 DIAGNOSIS — N201 Calculus of ureter: Secondary | ICD-10-CM | POA: Diagnosis not present

## 2021-04-08 ENCOUNTER — Ambulatory Visit: Payer: BC Managed Care – PPO | Admitting: Obstetrics & Gynecology

## 2021-04-22 ENCOUNTER — Other Ambulatory Visit: Payer: Self-pay | Admitting: Cardiovascular Disease

## 2021-04-22 ENCOUNTER — Encounter: Payer: Self-pay | Admitting: Cardiovascular Disease

## 2021-04-22 ENCOUNTER — Ambulatory Visit (INDEPENDENT_AMBULATORY_CARE_PROVIDER_SITE_OTHER): Payer: BC Managed Care – PPO | Admitting: Cardiovascular Disease

## 2021-04-22 ENCOUNTER — Other Ambulatory Visit: Payer: Self-pay

## 2021-04-22 ENCOUNTER — Ambulatory Visit (INDEPENDENT_AMBULATORY_CARE_PROVIDER_SITE_OTHER): Payer: BC Managed Care – PPO

## 2021-04-22 VITALS — BP 160/86 | HR 70 | Ht 66.0 in | Wt 220.6 lb

## 2021-04-22 DIAGNOSIS — Z86718 Personal history of other venous thrombosis and embolism: Secondary | ICD-10-CM

## 2021-04-22 DIAGNOSIS — R55 Syncope and collapse: Secondary | ICD-10-CM | POA: Diagnosis not present

## 2021-04-22 DIAGNOSIS — Z86711 Personal history of pulmonary embolism: Secondary | ICD-10-CM | POA: Diagnosis not present

## 2021-04-22 DIAGNOSIS — E7800 Pure hypercholesterolemia, unspecified: Secondary | ICD-10-CM

## 2021-04-22 NOTE — Patient Instructions (Addendum)
Medication Instructions:  NO CHANGES    *If you need a refill on your cardiac medications before your next appointment, please call your pharmacy*   Lab Work: FASTING TSH MAGNESIUM LIPID CMP If you have labs (blood work) drawn today and your tests are completely normal, you will receive your results only by: MyChart Message (if you have MyChart) OR A paper copy in the mail If you have any lab test that is abnormal or we need to change your treatment, we will call you to review the results.   Testing/Procedures:  WILL BE MAILED TO YOU  Your physician has recommended that you wear a holter monitor 14 DAYS ZIO. Holter monitors are medical devices that record the heart's electrical activity. Doctors most often use these monitors to diagnose arrhythmias. Arrhythmias are problems with the speed or rhythm of the heartbeat. The monitor is a small, portable device. You can wear one while you do your normal daily activities. This is usually used to diagnose what is causing palpitations/syncope (passing out).  AND  WILL BE SCHEDULE AT [address] Your physician has requested that you have an echocardiogram. Echocardiography is a painless test that uses sound waves to create images of your heart. It provides your doctor with information about the size and shape of your heart and how well your heart's chambers and valves are working. This procedure takes approximately one hour. There are no restrictions for this procedure.    Follow-Up: At [facility], you and your health needs are our priority.  As part of our continuing mission to provide you with exceptional heart care, we have created designated Provider Care Teams.  These Care Teams include your primary Cardiologist (physician) and Advanced Practice Providers (APPs -  Physician Assistants and Nurse Practitioners) who all work together to provide you with the care you need, when you need it.     Your next  appointment:   8 week(s)  The format for your next appointment:   In Person  Provider:   Nicki Guadalajara, MD   Other Instructions   Christena Deem- Long Term Monitor Instructions  Your physician has requested you wear a ZIO patch monitor for 14 days.  This is a single patch monitor. [employer] supplies one patch monitor per enrollment. Additional stickers are not available. Please do not apply patch if you will be having a Nuclear Stress Test,  Echocardiogram, Cardiac CT, MRI, or Chest Xray during the period you would be wearing the  monitor. The patch cannot be worn during these tests. You cannot remove and re-apply the  ZIO XT patch monitor.  Your ZIO patch monitor will be mailed 3 day USPS to your address on file. It may take 3-5 days  to receive your monitor after you have been enrolled.  Once you have received your monitor, please review the enclosed instructions. Your monitor  has already been registered assigning a specific monitor serial # to you.  Billing and Patient Assistance Program Information  We have supplied [employer] with any of your insurance information on file for billing purposes. [employer] offers a sliding scale Patient Assistance Program for patients that do not have  insurance, or whose insurance does not completely cover the cost of the ZIO monitor.  You must apply for the Patient Assistance Program to qualify for this discounted rate.  To apply, please call [employer] at [phone], select option 4, select option 2, ask to apply for  Patient Assistance Program. Meredeth Ide will ask your  household income, and how many people  are in your household. They will quote your out-of-pocket cost based on that information.  [employer] will also be able to set up a 19-month, interest-free payment plan if needed.  Applying the monitor   Shave hair from upper left chest.  Hold abrader disc by orange tab. Rub abrader in 40 strokes over the upper left chest as  indicated in your monitor  instructions.  Clean area with 4 enclosed alcohol pads. Let dry.  Apply patch as indicated in monitor instructions. Patch will be placed under collarbone on left  side of chest with arrow pointing upward.  Rub patch adhesive wings for 2 minutes. Remove white label marked "1". Remove the white  label marked "2". Rub patch adhesive wings for 2 additional minutes.  While looking in a mirror, press and release button in center of patch. A small green light will  flash 3-4 times. This will be your only indicator that the monitor has been turned on.  Do not shower for the first 24 hours. You may shower after the first 24 hours.  Press the button if you feel a symptom. You will hear a small click. Record Date, Time and  Symptom in the Patient Logbook.  When you are ready to remove the patch, follow instructions on the last 2 pages of Patient  Logbook. Stick patch monitor onto the last page of Patient Logbook.  Place Patient Logbook in the blue and white box. Use locking tab on box and tape box closed  securely. The blue and white box has prepaid postage on it. Please place it in the mailbox as  soon as possible. Your physician should have your test results approximately 7 days after the  monitor has been mailed back to [facility].  Call [facility] Customer Care at [phone] if you have questions regarding  your ZIO XT patch monitor. Call them immediately if you see an orange light blinking on your  monitor.  If your monitor falls off in less than 4 days, contact our Monitor department at [phone].  If your monitor becomes loose or falls off after 4 days call [employer] at [phone] for  suggestions on securing your monitor

## 2021-04-22 NOTE — Progress Notes (Unsigned)
Patient enrolled for [employer] to mail a 14 day ZIO XT monitor to address on file. 

## 2021-04-22 NOTE — Progress Notes (Signed)
Cardiology Office Note    Date:  05/02/2021   ID:  Betty Perkins, DOB [birth year], MRN [id number]  PCP:  Harlan Stains, MD  Cardiologist:  Shelva Majestic, MD   New cardiology consult referred through the courtesy of Dr. Harlan Stains.   History of Present Illness:  Betty Perkins is a 45 y.o. female who has a history of morbid obesity and underwent gastric sleeve bariatric surgery in 2019, as well as a history of DVT with bilateral pulmonary emboli approximately 8 years ago while on birth control pills who was anticoagulated for approximately 2 years.  She has a history of hypertension and in the past has experienced syncope.  She states her presyncopal syncopal spells oftentimes would last approximately 30 seconds, there were no associated seizures but she would notice an increased heart rate.  Remotely, she had been on lisinopril for hypertension but at the time of her s syncope she was on amlodipine at 5 mg but since her dose was reduced to 2.5 mg she has not had any further syncopal episodes.  She was recently evaluated by Dr. Harlan Stains and because of her unspecified syncope type, cardiology evaluation was recommended.  Presently, [name] denies any chest pain.  Prior to plastic see sleeve bariatric surgery she had weighed 320 pounds and subsequently lost 100 pounds.  In the past she correlated possible seizure activity related to her blood sugar.  She recently has experienced 3 episodes of mild discomfort between her shoulder blades radiating to her neck while asleep.  She denies any associated chest tightness.  She also has a history of hyperlipidemia and in December 2021 total cholesterol was 240, triglycerides 72, HDL 82 and LDL cholesterol was 146.  She presents for cardiology evaluation.   Past Medical History:  Diagnosis Date   DVT (deep venous thrombosis) (HCC)    linked to birth control, but has family history of PE, test negative for factor V   History of kidney  stones    Pulmonary embolism ([place])    linked to birth control, but has family history of PE, test negative for factor V   Superficial thrombophlebitis 12/2011    Past Surgical History:  Procedure Laterality Date   ANKLE SURGERY     right ankle   CESAREAN SECTION N/A 01/31/2016   Procedure: CESAREAN SECTION;  Surgeon: Crawford Givens, MD;  Location: [facility];  Service: Obstetrics;  Laterality: N/A;   DILATATION & CURETTAGE/HYSTEROSCOPY WITH MYOSURE N/A 08/19/2016   Procedure: DILATATION & CURETTAGE/HYSTEROSCOPY WITH MYOSURE;  Surgeon: Waymon Amato, MD;  Location: [facility];  Service: Gynecology;  Laterality: N/A;   EXTRACORPOREAL SHOCK WAVE LITHOTRIPSY Left 03/15/2020   Procedure: EXTRACORPOREAL SHOCK WAVE LITHOTRIPSY (ESWL);  Surgeon: Bjorn Loser, MD;  Location: [facility];  Service: Urology;  Laterality: Left;   EXTRACORPOREAL SHOCK WAVE LITHOTRIPSY Right 03/11/2021   Procedure: RIGHT EXTRACORPOREAL SHOCK WAVE LITHOTRIPSY (ESWL);  Surgeon: Raynelle Bring, MD;  Location: [facility];  Service: Urology;  Laterality: Right;   FOOT SURGERY     left foot   LAPAROSCOPIC GASTRIC SLEEVE RESECTION      Current Medications: Outpatient Medications Prior to Visit  Medication Sig Dispense Refill   amLODipine (NORVASC) 5 MG tablet Take 5 mg by mouth daily.     Calcium Carb-Cholecalciferol (CALCIUM 1000 + D PO) Take by mouth.     fexofenadine (ALLEGRA) 30 MG/5ML suspension Take 30 mg by mouth daily. Take 1 tablet by mouth daily as needed.  halobetasol (ULTRAVATE) 0.05 % cream Apply topically 2 (two) times daily.     ketorolac (TORADOL) 10 MG tablet Take 10 mg by mouth every 6 (six) hours as needed.     lansoprazole (PREVACID) 15 MG capsule Take 15 mg by mouth 2 (two) times daily before a meal.     levonorgestrel (MIRENA) 20 MCG/DAY IUD 1 each by Intrauterine route once.     Multiple Vitamins-Minerals (MULTIVITAMIN ADULT EXTRA C PO) multivitamin     No  facility-administered medications prior to visit.     Allergies:   Augmentin [amoxicillin-pot clavulanate]   Social History   Socioeconomic History   Marital status: Married    Spouse name: Not on file   Number of children: 0   Years of education: Not on file   Highest education level: Not on file  Occupational History    Comment: manicurist.   Tobacco Use   Smoking status: Former    Packs/day: 1.00    Years: 10.00    Pack years: 10.00    Types: Cigarettes    Start date: 09/11/1996    Quit date: 11/24/2006    Years since quitting: 14.4   Smokeless tobacco: Never   Tobacco comments:    quit smoking 7 years ago  Vaping Use   Vaping Use: Never used  Substance and Sexual Activity   Alcohol use: No    Alcohol/week: 0.0 standard drinks   Drug use: No   Sexual activity: Yes    Partners: Male    Birth control/protection: I.U.D.    Comment: 1ST intercourse- 21, partners-Mirena inserted 01-19-17  Other Topics Concern   Not on file  Social History Narrative   Not on file   Social Determinants of Health   Financial Resource Strain: Not on file  Food Insecurity: Not on file  Transportation Needs: Not on file  Physical Activity: Not on file  Stress: Not on file  Social Connections: Not on file    Additional social history is notable that she was born in [place].  She is married for 8 years and has a 35-year-old child.  She attended some college but did not graduate.  There is remote tobacco history of 10 years from age 8 through 35 when she quit.  She does not drink alcohol.  There is no drug use.  She does walk occasionally.   Family History:  The patient's family history includes Cancer in her maternal grandfather and paternal grandfather; Deep vein thrombosis in her brother, father, maternal aunt, mother, and another family member; Diabetes in her maternal grandmother; Hypertension in her father, maternal grandmother, and mother; Stroke in her maternal grandmother.    Mother is living at 30 and has hypertension, hyperlipidemia, history of blood clots with PE and DVT and diabetes.  Father is alive at 5 with hypertension.  There is heart disease and aortic aneurysm and stroke in maternal grandmother who died at 67.  Maternal grandfather died at 81 with lung cancer.  Her paternal grandmother died at 49 with emphysema and grandfather at 26 with throat cancer.  She has a brother age 73 who had blood clots.  ROS General: Negative; No fevers, chills, or night sweats; previous morbid obesity, status post bariatric surgery HEENT: Negative; No changes in vision or hearing, sinus congestion, difficulty swallowing Pulmonary: Negative; No cough, wheezing, shortness of breath, hemoptysis Cardiovascular: See HPI GI: Negative; No nausea, vomiting, diarrhea, or abdominal pain GU: Negative; No dysuria, hematuria, or difficulty voiding Musculoskeletal: Negative; no myalgias,  joint pain, or weakness Hematologic/Oncology: History of remote DVT/PE, patient was on anticoagulation for 2 years and evaluated by Dr. Marin Olp after negative hypercoagulable work-up Endocrine: Negative; no heat/cold intolerance; no diabetes Neuro: Negative; no changes in balance, headaches Skin: Negative; No rashes or skin lesions Psychiatric: Negative; No behavioral problems, depression Sleep: Negative; No snoring, daytime sleepiness, hypersomnolence, bruxism, restless legs, hypnogognic hallucinations, no cataplexy Other comprehensive 14 point system review is negative.   PHYSICAL EXAM:   VS:  BP (!) 160/86 (BP Location: Left Arm, Patient Position: Sitting, Cuff Size: Large)   Pulse 70   Ht 5' 6"  (1.676 m)   Wt 220 lb 9.6 oz (100.1 kg)   SpO2 97%   BMI 35.61 kg/m     Repeat blood pressure by me was 130/70 supine, 124/70 sitting, 120/74 standing  Wt Readings from Last 3 Encounters:  04/29/21 219 lb (99.3 kg)  04/22/21 220 lb 9.6 oz (100.1 kg)  03/11/21 220 lb (99.8 kg)    General: Alert,  oriented, no distress.  Skin: normal turgor, no rashes, warm and dry HEENT: Normocephalic, atraumatic. Pupils equal round and reactive to light; sclera anicteric; extraocular muscles intact;  Nose without nasal septal hypertrophy Mouth/Parynx benign; Mallinpatti scale 2 Neck: No JVD, no carotid bruits; normal carotid upstroke Lungs: clear to ausculatation and percussion; no wheezing or rales Chest wall: without tenderness to palpitation Heart: PMI not displaced, RRR, s1 s2 normal, 1/6 systolic murmur, no diastolic murmur, no rubs, gallops, thrills, or heaves Abdomen: soft, nontender; no hepatosplenomehaly, BS+; abdominal aorta nontender and not dilated by palpation. Back: no CVA tenderness Pulses 2+ Musculoskeletal: full range of motion, normal strength, no joint deformities Extremities: no clubbing cyanosis or edema, Homan's sign negative  Neurologic: grossly nonfocal; Cranial nerves grossly wnl Psychologic: Normal mood and affect   Studies/Labs Reviewed:   EKG:  EKG is ordered today.  NSR at 70 with minimal sinus arrythmia; PR 148 msec; QTc 427 msec  Recent Labs: BMP Latest Ref Rng & Units 02/02/2016 02/01/2016 02/01/2016  Glucose 65 - 99 mg/dL 84 101(H) 83  BUN 6 - 20 mg/dL 12 16 14   Creatinine 0.44 - 1.00 mg/dL 0.74 0.97 1.05(H)  BUN/Creat Ratio 9 - 23 - - -  Sodium 135 - 145 mmol/L 139 134(L) 138  Potassium 3.5 - 5.1 mmol/L 4.2 3.8 4.0  Chloride 101 - 111 mmol/L 105 103 105  CO2 22 - 32 mmol/L 24 23 24   Calcium 8.9 - 10.3 mg/dL 9.3 8.7(L) 8.7(L)     Hepatic Function Latest Ref Rng & Units 02/02/2016 02/01/2016 02/01/2016  Total Protein 6.5 - 8.1 g/dL 5.7(L) 4.8(L) 5.1(L)  Albumin 3.5 - 5.0 g/dL 2.2(L) 1.9(L) 2.0(L)  AST 15 - 41 U/L 25 22 21   ALT 14 - 54 U/L 19 17 17   Alk Phosphatase 38 - 126 U/L 123 125 123  Total Bilirubin 0.3 - 1.2 mg/dL 0.4 0.4 0.5    CBC Latest Ref Rng & Units 01/13/2017 08/15/2016 04/04/2016  WBC 3.8 - 10.8 K/uL 8.0 8.1 10.1(H)  Hemoglobin 11.7 - 15.5  g/dL 12.3 11.4(L) 12.3  Hematocrit 35.0 - 45.0 % 38.3 34.5(L) 36.4  Platelets 140 - 400 K/uL 346 303 360   Lab Results  Component Value Date   MCV 87.6 01/13/2017   MCV 86.9 08/15/2016   MCV 93 04/04/2016   Lab Results  Component Value Date   TSH 2.34 01/13/2017   Lab Results  Component Value Date   HGBA1C 5.4 01/13/2017  BNP No results found for: BNP  ProBNP No results found for: PROBNP   Lipid Panel  No results found for: CHOL, TRIG, HDL, CHOLHDL, VLDL, LDLCALC, LDLDIRECT, LABVLDL   RADIOLOGY: No results found.   Additional studies/ records that were reviewed today include:   I have reviewed the records from is not from physicians and evaluation by Dr. Harlan Stains.  Laboratory was reviewed.   ASSESSMENT:    1. Syncope and collapse   2. History of DVT (deep vein thrombosis)   3. History of pulmonary embolism   4. Morbid obesity ([place]): Status post gastric sleeve bariatric surgery 2019 with peak weight 320 pounds   5. Pure hypercholesterolemia     PLAN:  Ms. Margaretha Mahan is a 45 year old female who has a history of morbid obesity and underwent gastric sleeve bariatric surgery in 2019 with a subsequent 100 pound weight reduction from a peak weight of 320 pounds.  Approximately 8 years ago, she had suffered a left DVT and had bilateral pulmonary emboli.  She was on anticoagulation for 2 years but subsequently this was discontinued by Dr. Marin Olp after negative hypercoagulable work-up.  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  Approximately 8 months ago, she began to notice sporadic syncopal spells.  She believes some of this was related to her sugar.  However, over the last several months her dose of amlodipine was reduced to 2.5 mg and she is not had any passout spells since.  On exam today she is not orthostatic on exam.  She remains on  low-dose amlodipine at 2.5 mg.  Lipid studies in December 2021 showed an elevated LDL cholesterol at 146 with total cholesterol 240 although HDL was elevated at 82.  Her ECG today shows normal sinus rhythm with minimal sinus arrhythmia without significant ectopy.  Presently, I am recommending that she undergo a comprehensive 2D echo Doppler study for evaluation of systolic and diastolic function and valvular architecture.  I am scheduling her to wear a 2-week event monitor to further evaluate potential arrhythmic etiology.  Since 9 months have elapsed since her last lipid studies, I am recommending follow-up laboratory with a chemistry profile, TSH, magnesium and fasting lipid studies.  I will contact her regarding these results and plan to see her in approximately 6 to 8 weeks for follow-up evaluation or sooner as needed.   Medication Adjustments/Labs and Tests Ordered: Current medicines are reviewed at length with the patient today.  Concerns regarding medicines are outlined above.  Medication changes, Labs and Tests ordered today are listed in the Patient Instructions below. Patient Instructions  Medication Instructions:  NO CHANGES    *If you need a refill on your cardiac medications before your next appointment, please call your pharmacy*   Lab Work: FASTING TSH MAGNESIUM LIPID CMP If you have labs (blood work) drawn today and your tests are completely normal, you will receive your results only by: [place] (if you have MyChart) OR A paper copy in the mail If you have any lab test that is abnormal or we need to change your treatment, we will call you to review the results.   Testing/Procedures:  WILL BE MAILED TO YOU  Your physician has recommended that you wear a holter monitor 14 DAYS ZIO. Holter monitors are medical devices that record the heart's electrical activity. Doctors most often use these monitors to diagnose arrhythmias. Arrhythmias are  problems with the speed or  rhythm of the heartbeat. The monitor is a small, portable device. You can wear one while you do your normal daily activities. This is usually used to diagnose what is causing palpitations/syncope (passing out).  AND  WILL BE SCHEDULE AT [place] [address] Your physician has requested that you have an echocardiogram. Echocardiography is a painless test that uses sound waves to create images of your heart. It provides your doctor with information about the size and shape of your heart and how well your heart's chambers and valves are working. This procedure takes approximately one hour. There are no restrictions for this procedure.    Follow-Up: At [facility], you and your health needs are our priority.  As part of our continuing mission to provide you with exceptional heart care, we have created designated Provider Care Teams.  These Care Teams include your primary Cardiologist (physician) and Advanced Practice Providers (APPs -  Physician Assistants and Nurse Practitioners) who all work together to provide you with the care you need, when you need it.     Your next appointment:   8 week(s)  The format for your next appointment:   In Person  Provider:   Shelva Majestic, MD   Other Instructions   Bryn Gulling- Long Term Monitor Instructions  Your physician has requested you wear a ZIO patch monitor for 14 days.  This is a single patch monitor. [employer] supplies one patch monitor per enrollment. Additional stickers are not available. Please do not apply patch if you will be having a Nuclear Stress Test,  Echocardiogram, Cardiac CT, MRI, or Chest Xray during the period you would be wearing the  monitor. The patch cannot be worn during these tests. You cannot remove and re-apply the  ZIO XT patch monitor.  Your ZIO patch monitor will be mailed 3 day USPS to your address on file. It may take 3-5 days  to receive your monitor after you have been enrolled.  Once you have  received your monitor, please review the enclosed instructions. Your monitor  has already been registered assigning a specific monitor serial # to you.  Billing and Patient Assistance Program Information  We have supplied [employer] with any of your insurance information on file for billing purposes. [employer] offers a sliding scale Patient Assistance Program for patients that do not have  insurance, or whose insurance does not completely cover the cost of the ZIO monitor.  You must apply for the Patient Assistance Program to qualify for this discounted rate.  To apply, please call [employer] at [phone], select option 4, select option 2, ask to apply for  Patient Assistance Program. Theodore Demark will ask your household income, and how many people  are in your household. They will quote your out-of-pocket cost based on that information.  [employer] will also be able to set up a 44-month interest-free payment plan if needed.  Applying the monitor   Shave hair from upper left chest.  Hold abrader disc by orange tab. Rub abrader in 40 strokes over the upper left chest as  indicated in your monitor instructions.  Clean area with 4 enclosed alcohol pads. Let dry.  Apply patch as indicated in monitor instructions. Patch will be placed under collarbone on left  side of chest with arrow pointing upward.  Rub patch adhesive wings for 2 minutes. Remove white label marked "1". Remove the white  label marked "2". Rub patch adhesive wings for 2 additional minutes.  While looking in a mirror, press and release button in center of patch. A small green light will  flash 3-4 times. This will be your only indicator that the monitor has been turned on.  Do not shower for the first 24 hours. You may shower after the first 24 hours.  Press the button if you feel a symptom. You will hear a small click. Record Date, Time and  Symptom in the Patient Logbook.  When you are ready to remove the patch, follow instructions on  the last 2 pages of Patient  Logbook. Stick patch monitor onto the last page of Patient Logbook.  Place Patient Logbook in the blue and white box. Use locking tab on box and tape box closed  securely. The blue and white box has prepaid postage on it. Please place it in the mailbox as  soon as possible. Your physician should have your test results approximately 7 days after the  monitor has been mailed back to [facility].  Call [place] at [phone] if you have questions regarding  your ZIO XT patch monitor. Call them immediately if you see an orange light blinking on your  monitor.  If your monitor falls off in less than 4 days, contact our Monitor department at [phone].  If your monitor becomes loose or falls off after 4 days call [employer] at [phone] for  suggestions on securing your monitor    Signed, Shelva Majestic, MD  05/02/2021 2:31 PM    [place] [address], [place], [place], [place]  [address] Phone: [phone]

## 2021-04-29 ENCOUNTER — Other Ambulatory Visit: Payer: Self-pay

## 2021-04-29 ENCOUNTER — Ambulatory Visit (INDEPENDENT_AMBULATORY_CARE_PROVIDER_SITE_OTHER): Payer: BC Managed Care – PPO | Admitting: Obstetrics & Gynecology

## 2021-04-29 ENCOUNTER — Encounter: Payer: Self-pay | Admitting: Obstetrics & Gynecology

## 2021-04-29 VITALS — BP 110/74 | HR 53 | Ht 66.0 in | Wt 219.0 lb

## 2021-04-29 DIAGNOSIS — Z30431 Encounter for routine checking of intrauterine contraceptive device: Secondary | ICD-10-CM

## 2021-04-29 DIAGNOSIS — Z01419 Encounter for gynecological examination (general) (routine) without abnormal findings: Secondary | ICD-10-CM

## 2021-04-29 DIAGNOSIS — N83201 Unspecified ovarian cyst, right side: Secondary | ICD-10-CM | POA: Diagnosis not present

## 2021-04-29 DIAGNOSIS — Z6835 Body mass index (BMI) 35.0-35.9, adult: Secondary | ICD-10-CM

## 2021-04-29 DIAGNOSIS — E66812 Morbid (severe) obesity due to excess calories: Secondary | ICD-10-CM

## 2021-04-29 NOTE — Progress Notes (Signed)
Betty Perkins 03-16-1976 [id number]   History:    45 y.o. G1P1L1 Married.  Daughter is 30 yo.   RP:  Established patient presenting for annual gyn exam    HPI: Well on Mirena IUD since May 2018.  No breakthrough bleeding.  No pelvic pain.  No pain with intercourse.  Normal vaginal secretions.  Urine and bowel movements normal.  Had a Rt Lithotripsy with [facility].  MRI showed a Rt Ovarian cyst at about 4 cm per patient, will obtain report.  Breasts normal.  Screening mammo scheduled 05/06/2021.  Body mass index increased to 35.35.  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  Health labs with family physician.    Past medical history,surgical history, family history and social history were all reviewed and documented in the EPIC chart.  Gynecologic History No LMP recorded. (Menstrual status: IUD).  Obstetric History OB History  Gravida Para Term Preterm AB Living  1 1 1  0 0 1  SAB IAB Ectopic Multiple Live Births  0 0 0 0 1    # Outcome Date GA Lbr Len/2nd Weight Sex Delivery Anes PTL Lv  1 Term 01/31/16 [redacted]w[redacted]d  7 lb 5.6 oz (3.335 kg) F CS-LTranv EPI  LIV     ROS: A ROS was performed and pertinent positives and negatives are included in the history.  GENERAL: No fevers or chills. HEENT: No change in vision, no earache, sore throat or sinus congestion. NECK: No pain or stiffness. CARDIOVASCULAR: No chest pain or pressure. No palpitations. PULMONARY: No shortness of breath, cough or wheeze. GASTROINTESTINAL: No abdominal pain, nausea, vomiting or diarrhea, melena or bright red blood per rectum. GENITOURINARY: No urinary frequency, urgency, hesitancy or dysuria. MUSCULOSKELETAL: No joint or muscle pain, no back pain, no recent trauma. DERMATOLOGIC: No rash, no itching, no lesions. ENDOCRINE: No polyuria, polydipsia, no heat or cold intolerance. No recent change in weight. HEMATOLOGICAL: No anemia or easy bruising or bleeding. NEUROLOGIC: No headache, seizures, numbness, tingling or  weakness. PSYCHIATRIC: No depression, no loss of interest in normal activity or change in sleep pattern.     Exam:   BP 110/74   Pulse (!) 53   Ht 5\' 6"  (1.676 m)   Wt 219 lb (99.3 kg)   SpO2 100%   BMI 35.35 kg/m   Body mass index is 35.35 kg/m.  General appearance : Well developed well nourished female. No acute distress HEENT: Eyes: no retinal hemorrhage or exudates,  Neck supple, trachea midline, no carotid bruits, no thyroidmegaly Lungs: Clear to auscultation, no rhonchi or wheezes, or rib retractions  Heart: Regular rate and rhythm, no murmurs or gallops Breast:Examined in sitting and supine position were symmetrical in appearance, no palpable masses or tenderness,  no skin retraction, no nipple inversion, no nipple discharge, no skin discoloration, no axillary or supraclavicular lymphadenopathy Abdomen: no palpable masses or tenderness, no rebound or guarding Extremities: no edema or skin discoloration or tenderness  Pelvic: Vulva: Normal             Vagina: No gross lesions or discharge  Cervix: No gross lesions or discharge.  [facility].  Uterus  AV, normal size, shape and consistency, non-tender and mobile  Adnexa  Without masses or tenderness  Anus: Normal   Assessment/Plan:  45 y.o. female for annual exam   1. Well female exam with routine gynecological exam Normal gynecologic exam.  Pap Neg in 2021, no indication to repeat this year.  Breast exam normal.  Screening mammo 05/06/2021.  Health labs with fam MD.  2. Encounter for routine checking of intrauterine contraceptive device (IUD) Well on Mirena IUD x 2018.  IUD in good position.  3. Right ovarian cyst On MRI at [facility].  Will obtain report.  4. Class 2 severe obesity due to excess calories with serious comorbidity and body mass index (BMI) of 35.0 to 35.9 in adult [facility])  Post Bariatric surgery.  Will get back on a low calorie/carb diet.  Increase fitness activities.  Genia Del  MD, 9:10 AM 04/29/2021

## 2021-05-02 ENCOUNTER — Encounter: Payer: Self-pay | Admitting: Cardiovascular Disease

## 2021-05-06 ENCOUNTER — Ambulatory Visit
Admission: RE | Admit: 2021-05-06 | Discharge: 2021-05-06 | Disposition: A | Discharge status: Home / Self Care | Payer: BC Managed Care – PPO | Source: Ambulatory Visit | Attending: Obstetrics & Gynecology | Admitting: Obstetrics & Gynecology

## 2021-05-06 ENCOUNTER — Other Ambulatory Visit: Payer: Self-pay

## 2021-05-06 DIAGNOSIS — Z1231 Encounter for screening mammogram for malignant neoplasm of breast: Secondary | ICD-10-CM | POA: Diagnosis not present

## 2021-05-13 ENCOUNTER — Ambulatory Visit (HOSPITAL_COMMUNITY): Payer: BC Managed Care – PPO | Attending: Cardiovascular Disease

## 2021-05-13 ENCOUNTER — Other Ambulatory Visit: Payer: Self-pay

## 2021-05-13 DIAGNOSIS — R55 Syncope and collapse: Secondary | ICD-10-CM | POA: Diagnosis not present

## 2021-05-13 LAB — ECHOCARDIOGRAM COMPLETE
Area-P 1/2: 4.41 cm2
S' Lateral: 2.5 cm

## 2021-05-23 DIAGNOSIS — R55 Syncope and collapse: Secondary | ICD-10-CM

## 2021-05-27 DIAGNOSIS — I10 Essential (primary) hypertension: Secondary | ICD-10-CM | POA: Diagnosis not present

## 2021-05-27 DIAGNOSIS — Z23 Encounter for immunization: Secondary | ICD-10-CM | POA: Diagnosis not present

## 2021-08-05 ENCOUNTER — Other Ambulatory Visit: Payer: Self-pay

## 2021-08-05 ENCOUNTER — Other Ambulatory Visit: Payer: BC Managed Care – PPO | Admitting: *Deleted

## 2021-08-05 DIAGNOSIS — Z86711 Personal history of pulmonary embolism: Secondary | ICD-10-CM | POA: Diagnosis not present

## 2021-08-05 DIAGNOSIS — E7800 Pure hypercholesterolemia, unspecified: Secondary | ICD-10-CM | POA: Diagnosis not present

## 2021-08-05 DIAGNOSIS — Z86718 Personal history of other venous thrombosis and embolism: Secondary | ICD-10-CM | POA: Diagnosis not present

## 2021-08-05 DIAGNOSIS — R55 Syncope and collapse: Secondary | ICD-10-CM | POA: Diagnosis not present

## 2021-08-06 LAB — COMPREHENSIVE METABOLIC PANEL
ALT: 15 IU/L (ref 0–32)
AST: 19 IU/L (ref 0–40)
Albumin/Globulin Ratio: 1.8 (ref 1.2–2.2)
Albumin: 4.2 g/dL (ref 3.8–4.8)
Alkaline Phosphatase: 86 IU/L (ref 44–121)
BUN/Creatinine Ratio: 15 (ref 9–23)
BUN: 12 mg/dL (ref 6–24)
Bilirubin Total: 0.5 mg/dL (ref 0.0–1.2)
CO2: 19 mmol/L — ABNORMAL LOW (ref 20–29)
Calcium: 9.4 mg/dL (ref 8.7–10.2)
Chloride: 109 mmol/L — ABNORMAL HIGH (ref 96–106)
Creatinine, Ser: 0.8 mg/dL (ref 0.57–1.00)
Globulin, Total: 2.4 g/dL (ref 1.5–4.5)
Glucose: 89 mg/dL (ref 70–99)
Potassium: 4.3 mmol/L (ref 3.5–5.2)
Sodium: 144 mmol/L (ref 134–144)
Total Protein: 6.6 g/dL (ref 6.0–8.5)
eGFR: 94 mL/min/{1.73_m2} (ref >59)

## 2021-08-06 LAB — LIPID PANEL
Chol/HDL Ratio: 2.9 ratio (ref 0.0–4.4)
Cholesterol, Total: 199 mg/dL (ref 100–199)
HDL: 69 mg/dL (ref >39)
LDL Chol Calc (NIH): 121 mg/dL — ABNORMAL HIGH (ref 0–99)
Triglycerides: 47 mg/dL (ref 0–149)
VLDL Cholesterol Cal: 9 mg/dL (ref 5–40)

## 2021-08-06 LAB — MAGNESIUM: Magnesium: 2 mg/dL (ref 1.6–2.3)

## 2021-08-06 LAB — TSH: TSH: 2.11 u[IU]/mL (ref 0.450–4.500)

## 2021-08-12 ENCOUNTER — Ambulatory Visit: Payer: BC Managed Care – PPO | Admitting: Cardiovascular Disease

## 2021-09-06 ENCOUNTER — Ambulatory Visit: Payer: BC Managed Care – PPO | Admitting: Cardiovascular Disease

## 2021-09-30 DIAGNOSIS — N202 Calculus of kidney with calculus of ureter: Secondary | ICD-10-CM | POA: Diagnosis not present

## 2021-11-25 DIAGNOSIS — I10 Essential (primary) hypertension: Secondary | ICD-10-CM | POA: Diagnosis not present

## 2022-01-21 DIAGNOSIS — J0100 Acute maxillary sinusitis, unspecified: Secondary | ICD-10-CM | POA: Diagnosis not present

## 2022-04-10 ENCOUNTER — Encounter: Payer: Self-pay | Admitting: Podiatry

## 2022-04-10 ENCOUNTER — Ambulatory Visit (INDEPENDENT_AMBULATORY_CARE_PROVIDER_SITE_OTHER): Payer: BC Managed Care – PPO

## 2022-04-10 ENCOUNTER — Ambulatory Visit (INDEPENDENT_AMBULATORY_CARE_PROVIDER_SITE_OTHER): Payer: BC Managed Care – PPO | Admitting: Podiatry

## 2022-04-10 DIAGNOSIS — M79672 Pain in left foot: Secondary | ICD-10-CM | POA: Diagnosis not present

## 2022-04-10 DIAGNOSIS — M84374A Stress fracture, right foot, initial encounter for fracture: Secondary | ICD-10-CM

## 2022-04-10 DIAGNOSIS — M79671 Pain in right foot: Secondary | ICD-10-CM | POA: Diagnosis not present

## 2022-04-10 NOTE — Progress Notes (Signed)
  Subjective:  Patient ID: Betty Perkins, female    DOB: Oct 02, 1975,   MRN: [id number]  No chief complaint on file.   46 y.o. female presents for concern of bilateral foo pain that has been going on for 6 weeks. Relates a history of fractures to the feet. States she was doing a lot of extra walking about 6 weeks ago and started having pain over tops of both feet right worse than left. Purchased a surgical shoes and has been wearing on the right. Not taking anything for pain . Denies any other pedal complaints. Denies n/v/f/c.   Past Medical History:  Diagnosis Date   DVT (deep venous thrombosis) (HCC)    linked to birth control, but has family history of PE, test negative for factor V   History of kidney stones    Pulmonary embolism (HCC)    linked to birth control, but has family history of PE, test negative for factor V   Superficial thrombophlebitis 12/2011    Objective:  Physical Exam: Vascular: DP/PT pulses 2/4 bilateral. CFT <3 seconds. Normal hair growth on digits. No edema.  Skin. No lacerations or abrasions bilateral feet.  Musculoskeletal: MMT 5/5 bilateral lower extremities in DF, PF, Inversion and Eversion. Deceased ROM in DF of ankle joint. Tender to palpation over dorsal third and fourth metatarsals bilateral more so on the right. There is pain with vibratory test over third and fourth metatarsals bilateral as well mores so on the right.  Neurological: Sensation intact to light touch.   Assessment:   1. Right foot pain      Plan:  Patient was evaluated and treated and all questions answered. X-rays reviewed and discussed with patient. No acute fractures or dislocations noted. Some minimal periosteal reaction around right third and fourth metatarsals and left third and fourth.  Retained hardware in right ankle and left fifth metatarsal, left fifth metatarsal screw not completely in fifth metatarsal.  -Xrays reviewed -Discussed treatement options for stress reaction  and fracture; risks, alternatives, and benefits explained. - Continue with surgical shoe. Patient to wear at all times and instructed on use -Recommend protection, rest, ice, elevation daily until symptoms improve -Rx pain med/antinflammatories as needed -Patient to return to office in 4 weeks for serial x-rays to assess healing  or sooner if condition worsens.    Louann Sjogren, DPM

## 2022-05-01 ENCOUNTER — Ambulatory Visit (INDEPENDENT_AMBULATORY_CARE_PROVIDER_SITE_OTHER): Payer: BC Managed Care – PPO | Admitting: Obstetrics & Gynecology

## 2022-05-01 ENCOUNTER — Encounter: Payer: Self-pay | Admitting: Obstetrics & Gynecology

## 2022-05-01 VITALS — BP 120/76 | HR 80 | Resp 14 | Ht 65.5 in | Wt 242.0 lb

## 2022-05-01 DIAGNOSIS — Z01419 Encounter for gynecological examination (general) (routine) without abnormal findings: Secondary | ICD-10-CM

## 2022-05-01 DIAGNOSIS — E6609 Other obesity due to excess calories: Secondary | ICD-10-CM

## 2022-05-01 DIAGNOSIS — N760 Acute vaginitis: Secondary | ICD-10-CM | POA: Diagnosis not present

## 2022-05-01 DIAGNOSIS — Z30431 Encounter for routine checking of intrauterine contraceptive device: Secondary | ICD-10-CM

## 2022-05-01 DIAGNOSIS — B9689 Other specified bacterial agents as the cause of diseases classified elsewhere: Secondary | ICD-10-CM

## 2022-05-01 DIAGNOSIS — Z6839 Body mass index (BMI) 39.0-39.9, adult: Secondary | ICD-10-CM | POA: Diagnosis not present

## 2022-05-01 MED ORDER — TINIDAZOLE 500 MG PO TABS: 1000.0000 mg | ORAL_TABLET | Freq: Two times a day (BID) | ORAL | 0 refills | Status: AC

## 2022-05-01 NOTE — Progress Notes (Signed)
Betty Perkins April 12, 1976 [id number]   History:    46 y.o. G1P1L1 Married.  Daughter is 67 yo.   RP:  Established patient presenting for annual gyn exam    HPI: Well on Mirena IUD since May 2018.  No breakthrough bleeding.  No pelvic pain.  No pain with intercourse.  Pap Neg 03/2020. No h/o abnormal Pap.  Will repeat at 5 yrs.   Normal vaginal secretions, but odor with menses.  Urine and bowel movements normal.  Breasts normal.  Screening mammo Neg 05/06/2021.  Body mass index increased to 39.66.  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  BD Normal 06/2020. Health labs with family physician.  Colono to schedule next year.    Past medical history,surgical history, family history and social history were all reviewed and documented in the EPIC chart.  Gynecologic History No LMP recorded. (Menstrual status: IUD).  Obstetric History OB History  Gravida Para Term Preterm AB Living  1 1 1  0 0 1  SAB IAB Ectopic Multiple Live Births  0 0 0 0 1    # Outcome Date GA Lbr Len/2nd Weight Sex Delivery Anes PTL Lv  1 Term 01/31/16 [redacted]w[redacted]d  7 lb 5.6 oz (3.335 kg) F CS-LTranv EPI  LIV    ROS: A ROS was performed and pertinent positives and negatives are included in the history. GENERAL: No fevers or chills. HEENT: No change in vision, no earache, sore throat or sinus congestion. NECK: No pain or stiffness. CARDIOVASCULAR: No chest pain or pressure. No palpitations. PULMONARY: No shortness of breath, cough or wheeze. GASTROINTESTINAL: No abdominal pain, nausea, vomiting or diarrhea, melena or bright red blood per rectum. GENITOURINARY: No urinary frequency, urgency, hesitancy or dysuria. MUSCULOSKELETAL: No joint or muscle pain, no back pain, no recent trauma. DERMATOLOGIC: No rash, no itching, no lesions. ENDOCRINE: No polyuria, polydipsia, no heat or cold intolerance. No recent change in weight. HEMATOLOGICAL: No anemia or easy bruising or bleeding. NEUROLOGIC: No headache, seizures, numbness, tingling or  weakness. PSYCHIATRIC: No depression, no loss of interest in normal activity or change in sleep pattern.     Exam:   BP 120/76 (BP Location: Left Arm, Patient Position: Sitting, Cuff Size: Large)   Pulse 80   Resp 14   Ht 5' 5.5" (1.664 m)   Wt 242 lb (109.8 kg)   BMI 39.66 kg/m   Body mass index is 39.66 kg/m.  General appearance : Well developed well nourished female. No acute distress HEENT: Eyes: no retinal hemorrhage or exudates,  Neck supple, trachea midline, no carotid bruits, no thyroidmegaly Lungs: Clear to auscultation, no rhonchi or wheezes, or rib retractions  Heart: Regular rate and rhythm, no murmurs or gallops Breast:Examined in sitting and supine position were symmetrical in appearance, no palpable masses or tenderness,  no skin retraction, no nipple inversion, no nipple discharge, no skin discoloration, no axillary or supraclavicular lymphadenopathy Abdomen: no palpable masses or tenderness, no rebound or guarding Extremities: no edema or skin discoloration or tenderness  Pelvic: Vulva: Normal             Vagina: No gross lesions.  Mild discharge  Cervix: No gross lesions or discharge.  [facility] strings [facility] at [facility].  Uterus  AV, normal size, shape and consistency, non-tender and mobile  Adnexa  Without masses or tenderness  Anus: Normal   Assessment/Plan:  46 y.o. female for annual exam   1. Well female exam with routine gynecological exam Well on Mirena IUD since May  2018.  No breakthrough bleeding.  No pelvic pain.  No pain with intercourse.  Pap Neg 03/2020. No h/o abnormal Pap.  Will repeat at 5 yrs.   Normal vaginal secretions, but odor with menses.  Urine and bowel movements normal.  Breasts normal.  Screening mammo Neg 05/06/2021.  Body mass index increased to 39.66.  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  BD Normal 06/2020. Health labs with family physician.  Colono to schedule next year.   2. Encounter for routine checking of intrauterine contraceptive  device (IUD) Well on Mirena IUD since May 2018.  No breakthrough bleeding.  No pelvic pain.  No pain with intercourse.  3. Bacterial vaginosis Normal vaginal secretions, but odor with menses.  Mild vaginal discharge on exam.  Will treat with Tinidazole.  Usage reviewed and prescription sent to pharmacy.  4. Class 2 obesity due to excess calories without serious comorbidity with body mass index (BMI) of 39.0 to 39.9 in adult Had a Gastric sleeve surgery 07/2018.  Lower calorie/carb diet.  Increase fitness activities.  Other orders - amLODipine (NORVASC) 2.5 MG tablet; 1 tablet - tinidazole (TINDAMAX) 500 MG tablet; Take 2 tablets (1,000 mg total) by mouth 2 (two) times daily for 2 days.   [name], 9:26 AM 05/01/2022

## 2022-05-08 ENCOUNTER — Ambulatory Visit (INDEPENDENT_AMBULATORY_CARE_PROVIDER_SITE_OTHER): Payer: BC Managed Care – PPO

## 2022-05-08 ENCOUNTER — Encounter: Payer: Self-pay | Admitting: Podiatry

## 2022-05-08 ENCOUNTER — Ambulatory Visit (INDEPENDENT_AMBULATORY_CARE_PROVIDER_SITE_OTHER): Payer: BC Managed Care – PPO | Admitting: Podiatry

## 2022-05-08 DIAGNOSIS — M79671 Pain in right foot: Secondary | ICD-10-CM | POA: Diagnosis not present

## 2022-05-08 DIAGNOSIS — M84374A Stress fracture, right foot, initial encounter for fracture: Secondary | ICD-10-CM | POA: Diagnosis not present

## 2022-05-08 DIAGNOSIS — G5761 Lesion of plantar nerve, right lower limb: Secondary | ICD-10-CM | POA: Diagnosis not present

## 2022-05-08 MED ORDER — METHYLPREDNISOLONE 4 MG PO TBPK: ORAL_TABLET | ORAL | 0 refills | Status: DC

## 2022-05-08 NOTE — Progress Notes (Signed)
  Subjective:  Patient ID: Betty Perkins, female    DOB: [birth year],   MRN: [id number]  Chief Complaint  Patient presents with   right foot    Right foot pain - 4 week stress test , patient states overall her foot is much better     46 y.o. female presents for follow-up of right foot pain. Relates overall she is doing better and the shoe is helping. Relates she does have pain when she tried to walk out of the shoe. Relates she feels like she is walking on a rock and gets pain when she squeezes her foot. Denies any other pedal complaints. Denies n/v/f/c.   Past Medical History:  Diagnosis Date   DVT (deep venous thrombosis) (HCC)    linked to birth control, but has family history of PE, test negative for factor V   History of kidney stones    Pulmonary embolism (HCC)    linked to birth control, but has family history of PE, test negative for factor V   Superficial thrombophlebitis 12/2011    Objective:  Physical Exam: Vascular: DP/PT pulses 2/4 bilateral. CFT <3 seconds. Normal hair growth on digits. No edema.  Skin. No lacerations or abrasions bilateral feet.  Musculoskeletal: MMT 5/5 bilateral lower extremities in DF, PF, Inversion and Eversion. Deceased ROM in DF of ankle joint. Tender to palpation over dorsal third and fourth metatarsals on the right that has improved.  No more pain in the third interspace with pain with metatarsal squeeze and positive mulder's click.   Neurological: Sensation intact to light touch.   Assessment:   1. Morton's neuroma, right   2. Stress reaction of right foot, initial encounter      Plan:  Patient was evaluated and treated and all questions answered. X-rays reviewed and discussed with patient. No acute fractures or dislocations noted. No changes from previous X-ray/  -Discussed treatement options for stress reaction and possible neuroma and fracture; risks, alternatives, and benefits explained. - Continue with surgical shoe. Patient to  wear at all times and instructed on use -Recommend protection, rest, ice, elevation daily until symptoms improve -Metatarsal padding provided to help offload.  -Medrol dose pack provided.  -Rx pain med/antinflammatories as needed -Patient to return to office in 4 weeks for re-eval.     Louann Sjogren, DPM

## 2022-05-13 ENCOUNTER — Other Ambulatory Visit: Payer: Self-pay | Admitting: Family Medicine

## 2022-05-13 DIAGNOSIS — Z1231 Encounter for screening mammogram for malignant neoplasm of breast: Secondary | ICD-10-CM

## 2022-05-16 ENCOUNTER — Ambulatory Visit: Payer: BC Managed Care – PPO

## 2022-05-16 ENCOUNTER — Ambulatory Visit
Admission: RE | Admit: 2022-05-16 | Discharge: 2022-05-16 | Disposition: A | Discharge status: Home / Self Care | Payer: BC Managed Care – PPO | Source: Ambulatory Visit | Attending: Family Medicine | Admitting: Family Medicine

## 2022-05-16 DIAGNOSIS — Z1231 Encounter for screening mammogram for malignant neoplasm of breast: Secondary | ICD-10-CM

## 2022-06-12 ENCOUNTER — Ambulatory Visit: Payer: BC Managed Care – PPO | Admitting: Podiatry

## 2022-09-04 DIAGNOSIS — F439 Reaction to severe stress, unspecified: Secondary | ICD-10-CM | POA: Diagnosis not present

## 2022-09-04 DIAGNOSIS — I10 Essential (primary) hypertension: Secondary | ICD-10-CM | POA: Diagnosis not present

## 2022-09-04 DIAGNOSIS — Z9884 Bariatric surgery status: Secondary | ICD-10-CM | POA: Diagnosis not present

## 2022-10-28 ENCOUNTER — Other Ambulatory Visit (HOSPITAL_COMMUNITY): Payer: Self-pay | Admitting: Physician Assistant

## 2022-10-28 DIAGNOSIS — Z9884 Bariatric surgery status: Secondary | ICD-10-CM | POA: Diagnosis not present

## 2022-10-28 DIAGNOSIS — R1013 Epigastric pain: Secondary | ICD-10-CM | POA: Diagnosis not present

## 2022-10-28 DIAGNOSIS — K219 Gastro-esophageal reflux disease without esophagitis: Secondary | ICD-10-CM | POA: Diagnosis not present

## 2022-10-28 DIAGNOSIS — R1012 Left upper quadrant pain: Secondary | ICD-10-CM

## 2022-11-07 ENCOUNTER — Ambulatory Visit (HOSPITAL_COMMUNITY)
Admission: RE | Admit: 2022-11-07 | Discharge: 2022-11-07 | Disposition: A | Discharge status: Home / Self Care | Payer: BC Managed Care – PPO | Source: Ambulatory Visit | Attending: Physician Assistant | Admitting: Physician Assistant

## 2022-11-07 DIAGNOSIS — R1012 Left upper quadrant pain: Secondary | ICD-10-CM | POA: Diagnosis not present

## 2022-11-07 DIAGNOSIS — Z9884 Bariatric surgery status: Secondary | ICD-10-CM | POA: Diagnosis not present

## 2022-11-07 DIAGNOSIS — K449 Diaphragmatic hernia without obstruction or gangrene: Secondary | ICD-10-CM | POA: Diagnosis not present

## 2022-11-07 DIAGNOSIS — K219 Gastro-esophageal reflux disease without esophagitis: Secondary | ICD-10-CM | POA: Diagnosis not present

## 2022-11-25 DIAGNOSIS — K5730 Diverticulosis of large intestine without perforation or abscess without bleeding: Secondary | ICD-10-CM | POA: Diagnosis not present

## 2022-11-25 DIAGNOSIS — Z1211 Encounter for screening for malignant neoplasm of colon: Secondary | ICD-10-CM | POA: Diagnosis not present

## 2022-11-25 DIAGNOSIS — K2930 Chronic superficial gastritis without bleeding: Secondary | ICD-10-CM | POA: Diagnosis not present

## 2022-11-25 DIAGNOSIS — Z9884 Bariatric surgery status: Secondary | ICD-10-CM | POA: Diagnosis not present

## 2022-11-25 DIAGNOSIS — R1012 Left upper quadrant pain: Secondary | ICD-10-CM | POA: Diagnosis not present

## 2022-12-26 DIAGNOSIS — K2930 Chronic superficial gastritis without bleeding: Secondary | ICD-10-CM | POA: Diagnosis not present

## 2023-03-12 DIAGNOSIS — F439 Reaction to severe stress, unspecified: Secondary | ICD-10-CM | POA: Diagnosis not present

## 2023-03-12 DIAGNOSIS — K219 Gastro-esophageal reflux disease without esophagitis: Secondary | ICD-10-CM | POA: Diagnosis not present

## 2023-03-12 DIAGNOSIS — I10 Essential (primary) hypertension: Secondary | ICD-10-CM | POA: Diagnosis not present

## 2023-04-23 ENCOUNTER — Other Ambulatory Visit: Payer: Self-pay | Admitting: Family Medicine

## 2023-04-23 DIAGNOSIS — Z1231 Encounter for screening mammogram for malignant neoplasm of breast: Secondary | ICD-10-CM

## 2023-05-08 ENCOUNTER — Ambulatory Visit: Payer: BC Managed Care – PPO | Admitting: Obstetrics & Gynecology

## 2023-05-22 ENCOUNTER — Ambulatory Visit
Admission: RE | Admit: 2023-05-22 | Discharge: 2023-05-22 | Disposition: A | Discharge status: Home / Self Care | Payer: BC Managed Care – PPO | Source: Ambulatory Visit

## 2023-05-22 DIAGNOSIS — Z1231 Encounter for screening mammogram for malignant neoplasm of breast: Secondary | ICD-10-CM | POA: Diagnosis not present

## 2023-07-03 ENCOUNTER — Ambulatory Visit: Payer: BC Managed Care – PPO | Admitting: Obstetrics and Gynecology

## 2023-07-29 ENCOUNTER — Encounter: Payer: Self-pay | Admitting: Obstetrics and Gynecology

## 2023-07-29 ENCOUNTER — Ambulatory Visit (INDEPENDENT_AMBULATORY_CARE_PROVIDER_SITE_OTHER): Payer: BC Managed Care – PPO | Admitting: Obstetrics and Gynecology

## 2023-07-29 ENCOUNTER — Other Ambulatory Visit (HOSPITAL_COMMUNITY)
Admission: RE | Admit: 2023-07-29 | Discharge: 2023-07-29 | Disposition: A | Discharge status: Home / Self Care | Payer: BC Managed Care – PPO | Source: Ambulatory Visit | Attending: Obstetrics and Gynecology | Admitting: Obstetrics and Gynecology

## 2023-07-29 VITALS — BP 116/72 | HR 85 | Ht 65.75 in | Wt 258.0 lb

## 2023-07-29 DIAGNOSIS — Z01419 Encounter for gynecological examination (general) (routine) without abnormal findings: Secondary | ICD-10-CM | POA: Insufficient documentation

## 2023-07-29 DIAGNOSIS — N898 Other specified noninflammatory disorders of vagina: Secondary | ICD-10-CM | POA: Diagnosis not present

## 2023-07-29 DIAGNOSIS — Z1211 Encounter for screening for malignant neoplasm of colon: Secondary | ICD-10-CM

## 2023-07-29 NOTE — Progress Notes (Signed)
47 y.o. y.o. female here for annual exam.  Patient's last menstrual period was 07/27/2023. Period Duration (Days): 3 Period Pattern: Regular Menstrual Control: Tampon, Maxi pad Dysmenorrhea: None   G1P1L1 Married.  Daughter is 46 yo.   Odorous discharge while on cycle   HPI: Well on Mirena IUD since May 2018. 3day cycle a month, and not  bothered by them.  No pelvic pain.  No pain with intercourse.  Pap Neg 03/2020. No h/o abnormal Pap.  Will repeat at 5 yrs.   Normal vaginal secretions, but odor with menses.  Urine and bowel movements normal.  Breasts normal.  Screening mammo Neg 05/22/23.  Body mass index increased to 39.66 to 41.  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  BD Normal 06/2020. Health labs with family physician.  Colono done 2024 repeat in 10 years   Body mass index is 41.96 kg/m.  Past medical history,surgical history, family history and social history were all reviewed and documented in the EPIC chart.  Blood pressure 116/72, pulse 85, height 5' 5.75" (1.67 m), weight 258 lb (117 kg), last menstrual period 07/27/2023, SpO2 98%.  No results found for: "DIAGPAP", "HPVHIGH", "ADEQPAP"  GYN HISTORY: No results found for: "DIAGPAP", "HPVHIGH", "ADEQPAP"  OB History  Gravida Para Term Preterm AB Living  1 1 1  0 0 1  SAB IAB Ectopic Multiple Live Births  0 0 0 0 1    # Outcome Date GA Lbr Len/2nd Weight Sex Type Anes PTL Lv  1 Term 01/31/16 [redacted]w[redacted]d  7 lb 5.6 oz (3.335 kg) F CS-LTranv EPI  LIV    Past Medical History:  Diagnosis Date   DVT (deep venous thrombosis) (HCC)    linked to birth control, but has family history of PE, test negative for factor V   History of kidney stones    Pulmonary embolism (HCC)    linked to birth control, but has family history of PE, test negative for factor V   Superficial thrombophlebitis 12/2011    Past Surgical History:  Procedure Laterality Date   ANKLE SURGERY     right ankle   CESAREAN SECTION N/A 01/31/2016   Procedure:  CESAREAN SECTION;  Surgeon: Jaymes Graff, MD;  Location: [facility];  Service: Obstetrics;  Laterality: N/A;   DILATATION & CURETTAGE/HYSTEROSCOPY WITH MYOSURE N/A 08/19/2016   Procedure: DILATATION & CURETTAGE/HYSTEROSCOPY WITH MYOSURE;  Surgeon: Hoover Browns, MD;  Location: [facility];  Service: Gynecology;  Laterality: N/A;   EXTRACORPOREAL SHOCK WAVE LITHOTRIPSY Left 03/15/2020   Procedure: EXTRACORPOREAL SHOCK WAVE LITHOTRIPSY (ESWL);  Surgeon: Alfredo Martinez, MD;  Location: [facility];  Service: Urology;  Laterality: Left;   EXTRACORPOREAL SHOCK WAVE LITHOTRIPSY Right 03/11/2021   Procedure: RIGHT EXTRACORPOREAL SHOCK WAVE LITHOTRIPSY (ESWL);  Surgeon: Heloise Purpura, MD;  Location[facility];  Service: Urology;  Laterality: Right;   FOOT SURGERY     left foot   LAPAROSCOPIC GASTRIC SLEEVE RESECTION      Current Outpatient Medications on File Prior to Visit  Medication Sig Dispense Refill   amLODipine (NORVASC) 5 MG tablet Take 5 mg by mouth daily.     Calcium Carb-Cholecalciferol (CALCIUM 1000 + D PO) Take by mouth.     fexofenadine (ALLEGRA) 30 MG/5ML suspension Take 30 mg by mouth daily. Take 1 tablet by mouth daily as needed.     halobetasol (ULTRAVATE) 0.05 % cream Apply topically 2 (two) times daily.     ketorolac (TORADOL) 10 MG tablet Take  10 mg by mouth every 6 (six) hours as needed.     lansoprazole (PREVACID) 15 MG capsule Take 15 mg by mouth 2 (two) times daily before a meal.     levonorgestrel (MIRENA) 20 MCG/DAY IUD 1 each by Intrauterine route once.     Multiple Vitamin (MULTIVITAMIN PO) Take by mouth.     No current facility-administered medications on file prior to visit.    Social History   Socioeconomic History   Marital status: Married    Spouse name: Not on file   Number of children: 0   Years of education: Not on file   Highest education level: Not on file  Occupational History    Comment: manicurist.   Tobacco Use    Smoking status: Former    Current packs/day: 0.00    Average packs/day: 1 pack/day for 10.2 years (10.2 ttl pk-yrs)    Types: Cigarettes    Start date: 09/11/1996    Quit date: 11/24/2006    Years since quitting: 16.6   Smokeless tobacco: Never   Tobacco comments:    quit smoking 7 years ago  Vaping Use   Vaping status: Never Used  Substance and Sexual Activity   Alcohol use: No    Alcohol/week: 0.0 standard drinks of alcohol   Drug use: No   Sexual activity: Yes    Partners: Male    Birth control/protection: I.U.D.    Comment: 1ST intercourse- 21, partners-Mirena inserted 01-19-17  Other Topics Concern   Not on file  Social History Narrative   Not on file   Social Determinants of Health   Financial Resource Strain: Not on file  Food Insecurity: Not on file  Transportation Needs: Not on file  Physical Activity: Not on file  Stress: Not on file  Social Connections: Unknown (01/16/2022)   Received from [facility]   Social Network    Social Network: Not on file  Intimate Partner Violence: Unknown (12/16/2021)   Received from [employer], [facility]   HITS    Physically Hurt: Not on file    Insult or Talk Down To: Not on file    Threaten Physical Harm: Not on file    Scream or Curse: Not on file    Family History  Problem Relation Age of Onset   Deep vein thrombosis Other    Deep vein thrombosis Mother    Hypertension Mother    Deep vein thrombosis Father    Hypertension Father    Deep vein thrombosis Brother    Deep vein thrombosis Maternal Aunt    Stroke Maternal Grandmother    Diabetes Maternal Grandmother    Hypertension Maternal Grandmother    Cancer Maternal Grandfather        lung   Cancer Paternal Grandfather        head/neck   throat    Breast cancer Neg Hx      Allergies  Allergen Reactions   Augmentin [Amoxicillin-Pot Clavulanate] Nausea And Vomiting    Has patient had a PCN reaction causing immediate rash,  facial/tongue/throat swelling, SOB or lightheadedness with hypotension: no Has patient had a PCN reaction causing severe rash involving mucus membranes or skin necrosis: no Has patient had a PCN reaction that required hospitalization no Has patient had a PCN reaction occurring within the last 10 years: no If all of the above answers are "NO", then may proceed with Cephalosporin use.    Lisinopril-Hydrochlorothiazide Other (See Comments)      Patient's  last menstrual period was Patient's last menstrual period was 07/27/2023.Marland Kitchen            Review of Systems Alls systems reviewed and are negative.     Physical Exam Constitutional:      Appearance: Normal appearance.  Genitourinary:     Vulva and urethral meatus normal.     No lesions in the vagina.     Right Labia: No rash, lesions or skin changes.    Left Labia: No lesions, skin changes or rash.    No vaginal discharge or tenderness.     No vaginal prolapse present.    No vaginal atrophy present.     Right Adnexa: not tender, not palpable and no mass present.    Left Adnexa: not tender, not palpable and no mass present.    No cervical motion tenderness or discharge.     IUD strings visualized.     Uterus is not enlarged, tender or irregular.     Uterus is anteverted.  Breasts:    Right: Normal.     Left: Normal.  HENT:     Head: Normocephalic.  Neck:     Thyroid: No thyroid mass, thyromegaly or thyroid tenderness.  Cardiovascular:     Rate and Rhythm: Normal rate and regular rhythm.     Heart sounds: Normal heart sounds, S1 normal and S2 normal.  Pulmonary:     Effort: Pulmonary effort is normal.     Breath sounds: Normal breath sounds and air entry.  Abdominal:     General: There is no distension.     Palpations: Abdomen is soft. There is no mass.     Tenderness: There is no abdominal tenderness. There is no guarding or rebound.  Musculoskeletal:        General: Normal range of motion.     Cervical back: Full  passive range of motion without pain, normal range of motion and neck supple. No tenderness.     Right lower leg: No edema.     Left lower leg: No edema.  Neurological:     Mental Status: She is alert.  Skin:    General: Skin is warm.  Psychiatric:        Mood and Affect: Mood normal.        Behavior: Behavior normal.        Thought Content: Thought content normal.  Vitals and nursing note reviewed. Exam conducted with a chaperone present.       A:         Well Woman GYN exam             Mirena IUD since 2018 and pleased with it.  Remove at 10 years                P:        Pap smear collected today Encouraged annual mammogram screening Colon cancer screening up-to-date DXA not indicated Labs and immunizations to do with PMD Discussed breast self exams Encouraged healthy lifestyle practices   No follow-ups on file.  Earley Favor

## 2023-07-31 LAB — CYTOLOGY - PAP
Comment: NEGATIVE
Diagnosis: NEGATIVE
High risk HPV: NEGATIVE

## 2023-08-03 ENCOUNTER — Other Ambulatory Visit: Payer: Self-pay | Admitting: Obstetrics and Gynecology

## 2023-08-03 ENCOUNTER — Other Ambulatory Visit: Payer: Self-pay | Admitting: *Deleted

## 2023-08-03 LAB — SURESWAB® ADVANCED VAGINITIS PLUS,TMA
C. trachomatis RNA, TMA: NOT DETECTED
CANDIDA SPECIES: NOT DETECTED
Candida glabrata: NOT DETECTED
N. gonorrhoeae RNA, TMA: NOT DETECTED
SURESWAB(R) ADV BACTERIAL VAGINOSIS(BV),TMA: POSITIVE — AB
TRICHOMONAS VAGINALIS (TV),TMA: NOT DETECTED

## 2023-08-03 MED ORDER — NUVESSA 1.3 % VA GEL: 5.0000 g | Freq: Once | VAGINAL | 0 refills | Status: DC

## 2023-08-03 NOTE — Telephone Encounter (Signed)
Left voicemail letting patient know that the pharmacy would not cover her medication & Dr Karma Greaser sent in another medication that she can check with the pharmacy about. I told her if they won't cover that one then she could see how much it would be to pay out of pocket or for her to check with goodrx.

## 2023-08-03 NOTE — Telephone Encounter (Signed)
Betty Perkins not covered by insurance, please offer alternative or advise CMA if PA is desired. Thanks.   CC: Joy

## 2023-10-19 DIAGNOSIS — Z9884 Bariatric surgery status: Secondary | ICD-10-CM | POA: Diagnosis not present

## 2023-10-19 DIAGNOSIS — I10 Essential (primary) hypertension: Secondary | ICD-10-CM | POA: Diagnosis not present

## 2023-10-19 DIAGNOSIS — Z79899 Other long term (current) drug therapy: Secondary | ICD-10-CM | POA: Diagnosis not present

## 2023-10-19 DIAGNOSIS — K219 Gastro-esophageal reflux disease without esophagitis: Secondary | ICD-10-CM | POA: Diagnosis not present

## 2024-01-27 DIAGNOSIS — R1013 Epigastric pain: Secondary | ICD-10-CM | POA: Diagnosis not present

## 2024-01-27 DIAGNOSIS — R079 Chest pain, unspecified: Secondary | ICD-10-CM | POA: Diagnosis not present

## 2024-02-12 DIAGNOSIS — R1310 Dysphagia, unspecified: Secondary | ICD-10-CM | POA: Diagnosis not present

## 2024-03-29 DIAGNOSIS — R1310 Dysphagia, unspecified: Secondary | ICD-10-CM | POA: Diagnosis not present

## 2024-03-29 DIAGNOSIS — Z9884 Bariatric surgery status: Secondary | ICD-10-CM | POA: Diagnosis not present

## 2024-06-24 ENCOUNTER — Other Ambulatory Visit: Payer: Self-pay | Admitting: Family Medicine

## 2024-06-24 DIAGNOSIS — Z1231 Encounter for screening mammogram for malignant neoplasm of breast: Secondary | ICD-10-CM

## 2024-07-08 ENCOUNTER — Ambulatory Visit

## 2024-07-22 ENCOUNTER — Ambulatory Visit
Admission: RE | Admit: 2024-07-22 | Discharge: 2024-07-22 | Disposition: A | Discharge status: Home / Self Care | Source: Ambulatory Visit | Attending: Family Medicine | Admitting: Family Medicine

## 2024-07-22 DIAGNOSIS — Z1231 Encounter for screening mammogram for malignant neoplasm of breast: Secondary | ICD-10-CM
